# Patient Record
Sex: Female | Born: 1955 | Hispanic: No | Marital: Married | Smoking: Never smoker
Health system: Southern US, Community
[De-identification: ages and names within clinical notes are randomized; demographics above are authoritative.]

## PROBLEM LIST (undated history)

## (undated) DIAGNOSIS — H35369 Drusen (degenerative) of macula, unspecified eye: Secondary | ICD-10-CM

## (undated) DIAGNOSIS — G43909 Migraine, unspecified, not intractable, without status migrainosus: Secondary | ICD-10-CM

## (undated) DIAGNOSIS — R7303 Prediabetes: Secondary | ICD-10-CM

## (undated) DIAGNOSIS — N200 Calculus of kidney: Secondary | ICD-10-CM

## (undated) DIAGNOSIS — H409 Unspecified glaucoma: Secondary | ICD-10-CM

## (undated) DIAGNOSIS — C50919 Malignant neoplasm of unspecified site of unspecified female breast: Secondary | ICD-10-CM

## (undated) DIAGNOSIS — I10 Essential (primary) hypertension: Secondary | ICD-10-CM

## (undated) DIAGNOSIS — H269 Unspecified cataract: Secondary | ICD-10-CM

## (undated) HISTORY — DX: Malignant neoplasm of unspecified site of unspecified female breast: C50.919

## (undated) HISTORY — DX: Unspecified glaucoma: H40.9

## (undated) HISTORY — PX: OTHER SURGICAL HISTORY: SHX169

## (undated) HISTORY — DX: Drusen (degenerative) of macula, unspecified eye: H35.369

## (undated) HISTORY — DX: Unspecified cataract: H26.9

## (undated) HISTORY — PX: EYE SURGERY: SHX253

## (undated) HISTORY — DX: Migraine, unspecified, not intractable, without status migrainosus: G43.909

---

## 2014-02-24 ENCOUNTER — Ambulatory Visit: Payer: Self-pay | Admitting: Family

## 2014-03-10 ENCOUNTER — Ambulatory Visit (INDEPENDENT_AMBULATORY_CARE_PROVIDER_SITE_OTHER): Payer: 59 | Admitting: Family

## 2014-03-10 ENCOUNTER — Other Ambulatory Visit: Payer: Self-pay | Admitting: Family

## 2014-03-10 ENCOUNTER — Telehealth: Payer: Self-pay | Admitting: *Deleted

## 2014-03-10 ENCOUNTER — Other Ambulatory Visit (HOSPITAL_COMMUNITY)
Admission: RE | Admit: 2014-03-10 | Discharge: 2014-03-10 | Disposition: A | Discharge status: Home / Self Care | Payer: 59 | Source: Ambulatory Visit | Attending: Family | Admitting: Family

## 2014-03-10 ENCOUNTER — Encounter: Payer: Self-pay | Admitting: Family

## 2014-03-10 VITALS — BP 118/80 | HR 65 | Temp 97.7°F | Resp 16 | Ht 62.0 in | Wt 162.1 lb

## 2014-03-10 DIAGNOSIS — Z0000 Encounter for general adult medical examination without abnormal findings: Secondary | ICD-10-CM

## 2014-03-10 DIAGNOSIS — Z1231 Encounter for screening mammogram for malignant neoplasm of breast: Secondary | ICD-10-CM

## 2014-03-10 DIAGNOSIS — G43909 Migraine, unspecified, not intractable, without status migrainosus: Secondary | ICD-10-CM

## 2014-03-10 DIAGNOSIS — Z1151 Encounter for screening for human papillomavirus (HPV): Secondary | ICD-10-CM | POA: Insufficient documentation

## 2014-03-10 DIAGNOSIS — R21 Rash and other nonspecific skin eruption: Secondary | ICD-10-CM

## 2014-03-10 DIAGNOSIS — Z01419 Encounter for gynecological examination (general) (routine) without abnormal findings: Secondary | ICD-10-CM

## 2014-03-10 LAB — CBC WITH DIFFERENTIAL/PLATELET
BASOS ABS: 0 10*3/uL (ref 0.0–0.1)
BASOS PCT: 0 % (ref 0–1)
EOS PCT: 3 % (ref 0–5)
Eosinophils Absolute: 0.3 10*3/uL (ref 0.0–0.7)
HEMATOCRIT: 37.8 % (ref 36.0–46.0)
Hemoglobin: 12.5 g/dL (ref 12.0–15.0)
LYMPHS PCT: 32 % (ref 12–46)
Lymphs Abs: 2.8 10*3/uL (ref 0.7–4.0)
MCH: 25.2 pg — ABNORMAL LOW (ref 26.0–34.0)
MCHC: 33.1 g/dL (ref 30.0–36.0)
MCV: 76.1 fL — AB (ref 78.0–100.0)
Monocytes Absolute: 0.6 10*3/uL (ref 0.1–1.0)
Monocytes Relative: 7 % (ref 3–12)
Neutro Abs: 5.1 10*3/uL (ref 1.7–7.7)
Neutrophils Relative %: 58 % (ref 43–77)
Platelets: 256 10*3/uL (ref 150–400)
RBC: 4.97 MIL/uL (ref 3.87–5.11)
RDW: 14.4 % (ref 11.5–15.5)
WBC: 8.8 10*3/uL (ref 4.0–10.5)

## 2014-03-10 LAB — BASIC METABOLIC PANEL WITH GFR
BUN: 12 mg/dL (ref 6–23)
CHLORIDE: 105 meq/L (ref 96–112)
CO2: 26 meq/L (ref 19–32)
CREATININE: 0.64 mg/dL (ref 0.50–1.10)
Calcium: 9.3 mg/dL (ref 8.4–10.5)
GFR, Est African American: >89 mL/min
GFR, Est Non African American: >89 mL/min
GLUCOSE: 106 mg/dL — AB (ref 70–99)
Potassium: 4.5 mEq/L (ref 3.5–5.3)
Sodium: 138 mEq/L (ref 135–145)

## 2014-03-10 LAB — HEPATIC FUNCTION PANEL
ALT: 17 U/L (ref 0–35)
AST: 18 U/L (ref 0–37)
Albumin: 3.8 g/dL (ref 3.5–5.2)
Alkaline Phosphatase: 64 U/L (ref 39–117)
BILIRUBIN INDIRECT: 0.3 mg/dL (ref 0.2–1.2)
Bilirubin, Direct: 0.1 mg/dL (ref 0.0–0.3)
Total Bilirubin: 0.4 mg/dL (ref 0.2–1.2)
Total Protein: 7.1 g/dL (ref 6.0–8.3)

## 2014-03-10 LAB — LIPID PANEL
Cholesterol: 265 mg/dL — ABNORMAL HIGH (ref 0–200)
HDL: 60 mg/dL (ref >39)
LDL Cholesterol: 185 mg/dL — ABNORMAL HIGH (ref 0–99)
TRIGLYCERIDES: 99 mg/dL (ref <150)
Total CHOL/HDL Ratio: 4.4 Ratio
VLDL: 20 mg/dL (ref 0–40)

## 2014-03-10 MED ORDER — BETAMETHASONE DIPROPIONATE 0.05 % EX CREA: TOPICAL_CREAM | Freq: Two times a day (BID) | CUTANEOUS | Status: DC

## 2014-03-10 NOTE — Assessment & Plan Note (Signed)
She reports good relief with prn use of aleve.  Monitor.

## 2014-03-10 NOTE — Assessment & Plan Note (Signed)
I suspect that she is sensitive to the latex in the gloves that she wears at work.  Recommend that she request latex free gloves from her employer. Also, will rx with diprolene cream PRN.

## 2014-03-10 NOTE — Telephone Encounter (Signed)
Once daily please 

## 2014-03-10 NOTE — Telephone Encounter (Signed)
Received message from pharmacy wanting to clarify directions for betamethasone cream. Directions state to apply once a day and twice a day.  Which direction to use?

## 2014-03-10 NOTE — Assessment & Plan Note (Signed)
We discussed healthy diet, exercise.  Refer for mammogram, dexa, colonoscopy.  Obtain fasting lab work.  Pap performed today.

## 2014-03-10 NOTE — Progress Notes (Signed)
Subjective:    Patient ID: Jarrett Chicoine, female    DOB: 1955/12/11, 58 y.o.   MRN: [id number]  HPI  Ms. Whitner is a 58 yr old female who presents today to establish care.  Has not had recent pcp.    Rash- reports rash on left hand- can be pruritic.  Wears latex gloves at work.    Migraines- reports that she has migraines on occasion- worse if she is in the sun.    Migraines- has migraine between once a week and once a week. Uses aleve with good relief.  Preventative care: Immunizations: last tetanus was 2008 Diet: reports healthy diet Exercise: none  Colonoscopy: never Dexa: never Pap Smear: never Mammogram: never   Review of Systems  Constitutional:       Reports stable weight  HENT: Negative for hearing loss and rhinorrhea.   Eyes:       Wears glasses  Respiratory: Negative for cough and shortness of breath.   Cardiovascular: Negative for chest pain.  Gastrointestinal:       Nausea with migraine  Genitourinary:       Occasional urinary incontinence.   Musculoskeletal:       Occasional hand and shoulder pain  Hematological: Negative for adenopathy.  Psychiatric/Behavioral:       Denies depression/anxiety   Past Medical History  Diagnosis Date  . Migraine     History   Social History  . Marital Status: Married    Spouse Name: N/A    Number of Children: N/A  . Years of Education: N/A   Occupational History  . Not on file.   Social History Main Topics  . Smoking status: Never Smoker   . Smokeless tobacco: Never Used  . Alcohol Use: No  . Drug Use: Not on file  . Sexual Activity: Not on file   Other Topics Concern  . 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 No pertinent past surgical history.  Family History  Problem Relation Age of Onset  . Heart disease Father    . Kidney disease Father     No Known Allergies  No current outpatient prescriptions on file prior to visit.   No current facility-administered medications on file prior to visit.    BP 118/80  Pulse 65  Temp(Src) 97.7 F (36.5 C) (Oral)  Resp 16  Ht 5\' 2"  (1.575 m)  Wt 162 lb 1.3 oz (73.519 kg)  BMI 29.64 kg/m2  SpO2 98%  LMP 12/02/2003        Objective:   Physical Exam Physical Exam  Constitutional: She is oriented to person, place, and time. She appears well-developed and well-nourished. No distress.  HENT:  Head: Normocephalic and atraumatic.  Right Ear: Tympanic membrane and ear canal normal.  Left Ear: Tympanic membrane and ear canal normal.  Mouth/Throat: Oropharynx is clear and moist.  Eyes: Pupils are equal, round, and reactive to light. No scleral icterus.  Neck: Normal range of motion. No thyromegaly present.  Cardiovascular: Normal rate and regular rhythm.   No murmur heard. Pulmonary/Chest: Effort normal and breath sounds normal. No respiratory distress. He has no wheezes. She has no rales. She exhibits no tenderness.  Abdominal: Soft. Bowel sounds are normal. He  exhibits no distension and no mass. There is no tenderness. There is no rebound and no guarding.  Musculoskeletal: She exhibits no edema.  Lymphadenopathy:    She has no cervical adenopathy.  Neurological: She is alert and oriented to person, place, and time. She exhibits normal muscle tone. Coordination normal.  Skin: Skin is warm and dry. dry, raised rash left dorsal hand.  Psychiatric: She has a normal mood and affect. Her behavior is normal. Judgment and thought content normal.  Breasts: Examined lying Right: Without masses, retractions, discharge or axillary adenopathy.  Left: Without masses, retractions, discharge or axillary adenopathy.  Inguinal/mons: Normal without inguinal adenopathy  External genitalia: Normal  BUS/Urethra/Skene's glands: Normal  Bladder: Normal  Vagina: Normal    Cervix: Normal  Uterus: normal in size, shape and contour. Midline and mobile  Adnexa/parametria:  Rt: Without masses or tenderness.  Lt: Without masses or tenderness.  Anus and perineum: Normal           Assessment & Plan:          Assessment & Plan:

## 2014-03-10 NOTE — Patient Instructions (Addendum)
Please complete lab work prior to leaving. Schedule mammogram on the first floor. We will contact you re: scheduling of colonoscopy and bone density. Try to use powder free/latex free gloves at home. Follow up in 1 year, sooner if problems/concerns.  Welcome to [facility]!

## 2014-03-10 NOTE — Telephone Encounter (Signed)
Notified pharmacist.

## 2014-03-11 LAB — URINALYSIS, ROUTINE W REFLEX MICROSCOPIC
BILIRUBIN URINE: NEGATIVE
Glucose, UA: NEGATIVE mg/dL
Hgb urine dipstick: NEGATIVE
Ketones, ur: NEGATIVE mg/dL
LEUKOCYTES UA: NEGATIVE
NITRITE: NEGATIVE
Protein, ur: NEGATIVE mg/dL
Specific Gravity, Urine: 1.017 (ref 1.005–1.030)
Urobilinogen, UA: 0.2 mg/dL (ref 0.0–1.0)
pH: 5.5 (ref 5.0–8.0)

## 2014-03-11 LAB — TSH: TSH: 2.123 u[IU]/mL (ref 0.350–4.500)

## 2014-03-13 ENCOUNTER — Telehealth: Payer: Self-pay | Admitting: Family

## 2014-03-13 ENCOUNTER — Ambulatory Visit (HOSPITAL_BASED_OUTPATIENT_CLINIC_OR_DEPARTMENT_OTHER)
Admission: RE | Admit: 2014-03-13 | Discharge: 2014-03-13 | Disposition: A | Discharge status: Home / Self Care | Payer: 59 | Source: Ambulatory Visit | Attending: Family | Admitting: Family

## 2014-03-13 DIAGNOSIS — E1129 Type 2 diabetes mellitus with other diabetic kidney complication: Secondary | ICD-10-CM | POA: Insufficient documentation

## 2014-03-13 DIAGNOSIS — E785 Hyperlipidemia, unspecified: Secondary | ICD-10-CM | POA: Insufficient documentation

## 2014-03-13 DIAGNOSIS — IMO0001 Reserved for inherently not codable concepts without codable children: Secondary | ICD-10-CM | POA: Insufficient documentation

## 2014-03-13 DIAGNOSIS — Z1231 Encounter for screening mammogram for malignant neoplasm of breast: Secondary | ICD-10-CM | POA: Insufficient documentation

## 2014-03-13 DIAGNOSIS — E119 Type 2 diabetes mellitus without complications: Secondary | ICD-10-CM

## 2014-03-13 HISTORY — DX: Reserved for inherently not codable concepts without codable children: IMO0001

## 2014-03-13 LAB — HEMOGLOBIN A1C
Hgb A1c MFr Bld: 6.5 % — ABNORMAL HIGH (ref <5.7)
Mean Plasma Glucose: 140 mg/dL — ABNORMAL HIGH (ref <117)

## 2014-03-13 MED ORDER — ATORVASTATIN CALCIUM 40 MG PO TABS: 40.0000 mg | ORAL_TABLET | Freq: Every day | ORAL | Status: DC

## 2014-03-13 NOTE — Telephone Encounter (Signed)
Please call pt and let her know that her blood work shows diabetes.  Sugar is at level that can be controlled with diet, exercise and weight loss.  Limit white starches (bread, rice, potatoes) and instead eat more fresh veggies. Pap smear is normal.  Cholesterol is very high.  I would like her to add a statin medication- especially important due to new finding of diabetes as well as aspirin 81mg  once daily.  Repeat flp/lft in 6 weeks.  Call if unusual muscle pain occurs after starting atorvastatin.

## 2014-03-14 NOTE — Telephone Encounter (Signed)
Left message for pt to return my call.

## 2014-03-17 ENCOUNTER — Ambulatory Visit (INDEPENDENT_AMBULATORY_CARE_PROVIDER_SITE_OTHER)
Admission: RE | Admit: 2014-03-17 | Discharge: 2014-03-17 | Disposition: A | Discharge status: Home / Self Care | Payer: 59 | Source: Ambulatory Visit | Attending: Family | Admitting: Family

## 2014-03-17 DIAGNOSIS — Z0000 Encounter for general adult medical examination without abnormal findings: Secondary | ICD-10-CM

## 2014-03-17 NOTE — Telephone Encounter (Signed)
Received message from pt's daughter stating pt received our previous message but wanted [place] to relay information to her daughter. They are listed on HIPPA. Called [phone] and left detailed message re: below results and to call if any questions.

## 2014-03-24 ENCOUNTER — Ambulatory Visit (INDEPENDENT_AMBULATORY_CARE_PROVIDER_SITE_OTHER): Payer: 59 | Admitting: Family

## 2014-03-24 ENCOUNTER — Telehealth: Payer: Self-pay | Admitting: Family

## 2014-03-24 VITALS — BP 104/64 | HR 76 | Temp 97.7°F | Resp 16 | Ht 62.0 in | Wt 162.0 lb

## 2014-03-24 DIAGNOSIS — R32 Unspecified urinary incontinence: Secondary | ICD-10-CM | POA: Insufficient documentation

## 2014-03-24 DIAGNOSIS — R079 Chest pain, unspecified: Secondary | ICD-10-CM | POA: Insufficient documentation

## 2014-03-24 DIAGNOSIS — E119 Type 2 diabetes mellitus without complications: Secondary | ICD-10-CM

## 2014-03-24 LAB — MICROALBUMIN / CREATININE URINE RATIO
Creatinine, Urine: 131.2 mg/dL
Microalb Creat Ratio: 5.3 mg/g (ref 0.0–30.0)
Microalb, Ur: 0.69 mg/dL (ref 0.00–1.89)

## 2014-03-24 NOTE — Assessment & Plan Note (Signed)
EKG is performed and reviewed- notes NSR without acute changes. Advised pt to proceed with stress test and to go directly to the ER if she develops recurrent chest pain.

## 2014-03-24 NOTE — Progress Notes (Signed)
Subjective:    Patient ID: Chloe Reid, female    DOB: 1956/07/22, 58 y.o.   MRN: [id number]  HPI  Chloe Reid is a 58 yr old female who presents today with two concerns:  1) Chest pain- reports that 4 days ago she developed left sided chest pain.  Started off slowly.  Pain radiated to her back and down the left arm. Occurred when she was cooking.  Pain is described as constant on the 1st day. Has been intermittent since that time.  She has not tried any medication.  She reports mild nausea the first day which resolved. She denies associated SOB.  Pain is not worsened by exertion.  Dad had MI, first MI was in his 28's.  Reports that she has had this pain in the past but the discomfort was not as bad last time.    2) Urinary incontinence- She reports + bladder incontinence.  Incontinence has been present for 1-2 years.   She is accompanied today by her daughter who helps to translate.  Review of Systems See HPI  Past Medical History  Diagnosis Date  . Migraine     History   Social History  . Marital Status: Married    Spouse Name: N/A    Number of Children: N/A  . Years of Education: N/A   Occupational History  . Not on file.   Social History Main Topics  . Smoking status: Never Smoker   . Smokeless tobacco: Never Used  . Alcohol Use: No  . Drug Use: Not on file  . Sexual Activity: Not on file   Other Topics Concern  . 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  Family History  Problem Relation Age of Onset  . Heart disease Father   . Kidney disease Father     No Known Allergies  Current Outpatient Prescriptions on File Prior to Visit  Medication Sig Dispense Refill  . aspirin EC 81 MG tablet Take 81 mg by mouth daily.      Marland Kitchen atorvastatin (LIPITOR) 40 MG tablet  Take 1 tablet (40 mg total) by mouth daily.  30 tablet  1  . Multiple Vitamin (MULTIVITAMIN) tablet Take 1 tablet by mouth daily.      . naproxen sodium (ALEVE) 220 MG tablet Take 220 mg by mouth as needed.      . betamethasone dipropionate (DIPROLENE) 0.05 % cream Apply topically 2 (two) times daily. Apply to hand rash once daily as needed.  30 g  0   No current facility-administered medications on file prior to visit.    BP 104/64  Pulse 76  Temp(Src) 97.7 F (36.5 C) (Oral)  Resp 16  Ht 5\' 2"  (1.575 m)  Wt 162 lb (73.483 kg)  BMI 29.62 kg/m2  SpO2 99%  LMP 12/02/2003       Objective:   Physical Exam  Constitutional: She is oriented to person, place, and time. She appears well-developed and well-nourished. No distress.  HENT:  Head: Normocephalic and atraumatic.  Cardiovascular: Normal rate and regular rhythm.   No murmur heard. Pulmonary/Chest: Effort normal and breath sounds normal. No respiratory distress. She has no wheezes. She has no rales. She exhibits  no tenderness.  Neurological: She is alert and oriented to person, place, and time.  Psychiatric: She has a normal mood and affect. Her behavior is normal. Judgment and thought content normal.          Assessment & Plan:

## 2014-03-24 NOTE — Assessment & Plan Note (Signed)
Will refer to GYN for further evaluation.  

## 2014-03-24 NOTE — Patient Instructions (Signed)
Please complete lab work prior to leaving. You will be contacted about your stress test and GYN referral. Let me know if you have not heard back in 1 week about these referrals. Go to the ER if you develop recurrent chest pain. Follow up in 3 months, sooner if problems or concerns.

## 2014-03-24 NOTE — Progress Notes (Signed)
Pre visit review using our clinic review tool, if applicable. No additional management support is needed unless otherwise documented below in the visit note. 

## 2014-03-24 NOTE — Assessment & Plan Note (Signed)
New diagnosis. Discussed diabetic diet.  Obtain urine microalbumin.

## 2014-03-24 NOTE — Telephone Encounter (Signed)
Prior [name] is obtained, number is:  [phone].

## 2014-03-24 NOTE — Telephone Encounter (Signed)
Message copied by Debbrah Alar on Fri Mar 24, 2014  2:45 PM ------      Message from: Synthia Innocent      Created: Fri Mar 24, 2014  2:36 PM       Pt will need peer to peer review for stress test. Please call insurance @ [phone] option #3 [id number]. Thanks ------

## 2014-03-27 ENCOUNTER — Telehealth: Payer: Self-pay

## 2014-03-27 NOTE — Telephone Encounter (Signed)
Relevant patient education mailed to patient.  

## 2014-03-30 ENCOUNTER — Telehealth: Payer: Self-pay | Admitting: Family

## 2014-03-30 ENCOUNTER — Encounter (HOSPITAL_COMMUNITY): Payer: 59

## 2014-03-30 NOTE — Telephone Encounter (Signed)
Message copied by Debbrah Alar on Thu Mar 30, 2014  8:29 PM ------      Message from: Hubbard Robinson      Created: Thu Mar 30, 2014  1:19 PM       Ms. Senkbeil was a no show for her nuclear stress test today. I attempted to call her to reschedule her with no answer.            Crissie Figures, RN ------

## 2014-03-31 ENCOUNTER — Encounter: Payer: Self-pay | Admitting: Family

## 2014-03-31 MED ORDER — CALCIUM CARBONATE-VITAMIN D 600-400 MG-UNIT PO TABS: 1.0000 | ORAL_TABLET | Freq: Every day | ORAL | Status: DC

## 2014-04-06 ENCOUNTER — Telehealth (HOSPITAL_COMMUNITY): Payer: Self-pay | Admitting: *Deleted

## 2014-04-10 ENCOUNTER — Encounter: Payer: Self-pay | Admitting: Family

## 2014-04-12 ENCOUNTER — Telehealth (HOSPITAL_COMMUNITY): Payer: Self-pay

## 2014-04-14 ENCOUNTER — Ambulatory Visit (HOSPITAL_COMMUNITY)
Admission: RE | Admit: 2014-04-14 | Discharge: 2014-04-14 | Disposition: A | Discharge status: Home / Self Care | Payer: 59 | Source: Ambulatory Visit | Attending: Cardiovascular Disease | Admitting: Cardiovascular Disease

## 2014-04-14 DIAGNOSIS — R079 Chest pain, unspecified: Secondary | ICD-10-CM | POA: Insufficient documentation

## 2014-04-14 MED ORDER — TECHNETIUM TC 99M SESTAMIBI GENERIC - CARDIOLITE
30.4000 | Freq: Once | INTRAVENOUS | Status: AC | PRN
  Administered 2014-04-14: 30 via INTRAVENOUS

## 2014-04-14 MED ORDER — TECHNETIUM TC 99M SESTAMIBI GENERIC - CARDIOLITE
10.5000 | Freq: Once | INTRAVENOUS | Status: AC | PRN
  Administered 2014-04-14: 11 via INTRAVENOUS

## 2014-04-14 NOTE — Procedures (Addendum)
[facility] [address] [place] [address] [phone]  Cardiology Nuclear Med Study  Chloe Reid is a 58 y.o. female     MRN : [id number]     DOB: 06-Sep-1956  Procedure Date: 04/14/2014  Nuclear Med Background Indication for Stress Test:  Evaluation for Ischemia History:  No prior cardiac or respiratory history reported. No prior NUC MPI fo rcomparison. Cardiac Risk Factors: Family History - CAD, Lipids, NIDDM and Overweight  Symptoms:  Chest Pain, Dizziness and DOE   Nuclear Pre-Procedure Caffeine/Decaff Intake:  12:00am NPO After: 10am   IV Site: R Forearm  IV 0.9% NS with Angio Cath:  22g  Chest Size (in):  n/a IV Started by: Rolene Course, RN  Height: 5\' 2"  (1.575 m)  Cup Size: D  BMI:  Body mass index is 29.62 kg/(m^2). Weight:  162 lb (73.483 kg)   Tech Comments:  n/a    Nuclear Med Study 1 or 2 day study: 1 day  Stress Test Type:  Stress  Order Authorizing Provider:  Willette Alma, MD   Resting Radionuclide: Technetium 54m Sestamibi  Resting Radionuclide Dose: 10.5 mCi   Stress Radionuclide:  Technetium 44m Sestamibi  Stress Radionuclide Dose: 30.4 mCi           Stress Protocol Rest HR: 60 Stress HR: 141  Rest BP: 146/91 Stress BP: 148/107  Exercise Time (min): 8:20 METS: 8.7   Predicted Max HR: 161 bpm % Max HR: 87.58 bpm Rate Pressure Product: 20868  Dose of Adenosine (mg):  n/a Dose of Lexiscan: n/a mg  Dose of Atropine (mg): n/a Dose of Dobutamine: n/a mcg/kg/min (at max HR)  Stress Test Technologist: Leane Para, CCT Nuclear Technologist: Imagene Riches, CNMT   Rest Procedure:  Myocardial perfusion imaging was performed at rest 45 minutes following the intravenous administration of Technetium 1m Sestamibi. Stress Procedure:  The patient performed treadmill exercise using a Bruce  Protocol for 8:20 minutes. The patient stopped due to SOB and Fatigue and denied any chest pain.  There were no  significant ST-T wave changes.  Technetium 65m Sestamibi was injected at peak exercise and myocardial perfusion imaging was performed after a brief delay.  Transient Ischemic Dilatation (Normal <1.22):  0.88 Lung/Heart Ratio (Normal <0.45):  0.29 QGS EDV:  56 ml QGS ESV:  15 ml LV Ejection Fraction: 73%      Rest ECG: NSR - Normal EKG  Stress ECG: No significant change from baseline ECG  QPS Raw Data Images:  Normal; no motion artifact; normal heart/lung ratio. Stress Images:  Normal homogeneous uptake in all areas of the myocardium. Rest Images:  Normal homogeneous uptake in all areas of the myocardium. Subtraction (SDS):  No evidence of ischemia. LV Wall Motion:  NL LV Function; NL Wall Motion  Impression Exercise Capacity:  Fair exercise capacity. BP Response:  Hypertensive blood pressure response. Clinical Symptoms:  No significant symptoms noted. ECG Impression:  No significant ST segment change suggestive of ischemia. Comparison with Prior Nuclear Study: No previous nuclear study performed   Overall Impression:  Normal stress nuclear study.   Sanda Klein, MD  04/14/2014 4:31 PM

## 2014-06-08 NOTE — Telephone Encounter (Signed)
Encounter complete. 

## 2014-06-13 ENCOUNTER — Telehealth: Payer: Self-pay

## 2014-06-13 DIAGNOSIS — E119 Type 2 diabetes mellitus without complications: Secondary | ICD-10-CM

## 2014-06-13 NOTE — Telephone Encounter (Signed)
Pt comes in on 06-30-14. Melissa already has lipid ordered I will add A1C

## 2014-06-30 ENCOUNTER — Ambulatory Visit: Payer: 59 | Admitting: Family

## 2014-07-07 ENCOUNTER — Encounter: Payer: Self-pay | Admitting: Family

## 2014-07-07 ENCOUNTER — Ambulatory Visit (INDEPENDENT_AMBULATORY_CARE_PROVIDER_SITE_OTHER): Payer: 59 | Admitting: Family

## 2014-07-07 VITALS — BP 120/80 | HR 63 | Temp 98.3°F | Resp 16 | Ht 62.0 in | Wt 159.1 lb

## 2014-07-07 DIAGNOSIS — N393 Stress incontinence (female) (male): Secondary | ICD-10-CM

## 2014-07-07 DIAGNOSIS — M8580 Other specified disorders of bone density and structure, unspecified site: Secondary | ICD-10-CM

## 2014-07-07 DIAGNOSIS — M949 Disorder of cartilage, unspecified: Secondary | ICD-10-CM

## 2014-07-07 DIAGNOSIS — E785 Hyperlipidemia, unspecified: Secondary | ICD-10-CM

## 2014-07-07 DIAGNOSIS — B351 Tinea unguium: Secondary | ICD-10-CM

## 2014-07-07 DIAGNOSIS — E559 Vitamin D deficiency, unspecified: Secondary | ICD-10-CM

## 2014-07-07 DIAGNOSIS — E119 Type 2 diabetes mellitus without complications: Secondary | ICD-10-CM

## 2014-07-07 DIAGNOSIS — M899 Disorder of bone, unspecified: Secondary | ICD-10-CM

## 2014-07-07 LAB — HEPATIC FUNCTION PANEL
ALBUMIN: 4.2 g/dL (ref 3.5–5.2)
ALK PHOS: 57 U/L (ref 39–117)
ALT: 16 U/L (ref 0–35)
AST: 17 U/L (ref 0–37)
Bilirubin, Direct: 0.1 mg/dL (ref 0.0–0.3)
Indirect Bilirubin: 0.3 mg/dL (ref 0.2–1.2)
TOTAL PROTEIN: 7.6 g/dL (ref 6.0–8.3)
Total Bilirubin: 0.4 mg/dL (ref 0.2–1.2)

## 2014-07-07 LAB — HEMOGLOBIN A1C
Hgb A1c MFr Bld: 6.6 % — ABNORMAL HIGH (ref <5.7)
Mean Plasma Glucose: 143 mg/dL — ABNORMAL HIGH (ref <117)

## 2014-07-07 LAB — LIPID PANEL
CHOLESTEROL: 244 mg/dL — AB (ref 0–200)
HDL: 70 mg/dL (ref >39)
LDL Cholesterol: 159 mg/dL — ABNORMAL HIGH (ref 0–99)
Total CHOL/HDL Ratio: 3.5 Ratio
Triglycerides: 73 mg/dL (ref <150)
VLDL: 15 mg/dL (ref 0–40)

## 2014-07-07 LAB — BASIC METABOLIC PANEL WITH GFR
BUN: 18 mg/dL (ref 6–23)
CALCIUM: 9.1 mg/dL (ref 8.4–10.5)
CO2: 22 meq/L (ref 19–32)
CREATININE: 0.61 mg/dL (ref 0.50–1.10)
Chloride: 107 mEq/L (ref 96–112)
GFR, Est African American: >89 mL/min
GFR, Est Non African American: >89 mL/min
GLUCOSE: 102 mg/dL — AB (ref 70–99)
Potassium: 4.1 mEq/L (ref 3.5–5.3)
Sodium: 139 mEq/L (ref 135–145)

## 2014-07-07 MED ORDER — TERBINAFINE HCL 250 MG PO TABS: 250.0000 mg | ORAL_TABLET | Freq: Every day | ORAL | Status: DC

## 2014-07-07 NOTE — Progress Notes (Signed)
Pre visit review using our clinic review tool, if applicable. No additional management support is needed unless otherwise documented below in the visit note. 

## 2014-07-07 NOTE — Progress Notes (Signed)
Subjective:    Patient ID: Chloe Reid, female    DOB: [birth year], 58 y.o.   MRN: [id number]  HPI  Ms. Shevlin is a 58 yr old female who presents today for follow up.  1) DM2-last eye exam was 2 years ago.   2) chest pain- was initially evaluated for this on 03/24/14.  She was referred for nuclear stress test which was completed on 04/14/14 and was negative. She reports that she has not recently experienced chest pain.   3) DM2-  Newly diagnosed last visit.  Reports + pneumovax 2011 Lab Results  Component Value Date   HGBA1C 6.5* 03/10/2014   Lab Results  Component Value Date   MICROALBUR 0.69 03/24/2014   LDLCALC 185* 03/10/2014   CREATININE 0.64 03/10/2014   4) Urinary incontinence- she was referred to GYN for further evaluation. She did not go.  She would like to reschedule.   5) Hyperlipidemia-  LDL was noted to be elevated. She was placed on atorvastatin. It was recommended that she repeat flp/lft in 6 weeks. Denies associated myalgia.    Review of Systems See HPI  Past Medical History  Diagnosis Date  . Migraine     History   Social History  . Marital Status: Married    Spouse Name: N/A    Number of Children: N/A  . Years of Education: N/A   Occupational History  . Not on file.   Social History Main Topics  . Smoking status: Never Smoker   . Smokeless tobacco: Never Used  . Alcohol Use: No  . Drug Use: Not on file  . Sexual Activity: Not on file   Other Topics Concern  . 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  Family History  Problem Relation Age of Onset  . Heart disease Father   . Kidney disease Father     No Known Allergies  Current Outpatient Prescriptions on File Prior to Visit  Medication Sig Dispense Refill  . aspirin EC 81 MG tablet Take  81 mg by mouth daily.      Marland Kitchen atorvastatin (LIPITOR) 40 MG tablet Take 1 tablet (40 mg total) by mouth daily.  30 tablet  1  . betamethasone dipropionate (DIPROLENE) 0.05 % cream Apply topically 2 (two) times daily. Apply to hand rash once daily as needed.  30 g  0  . naproxen sodium (ALEVE) 220 MG tablet Take 220 mg by mouth as needed.      . Calcium Carbonate-Vitamin D (CALTRATE 600+D) 600-400 MG-UNIT per tablet Take 1 tablet by mouth daily.      . Multiple Vitamin (MULTIVITAMIN) tablet Take 1 tablet by mouth daily.       No current facility-administered medications on file prior to visit.    BP 120/80  Pulse 63  Temp(Src) 98.3 F (36.8 C) (Oral)  Resp 16  Ht 5\' 2"  (1.575 m)  Wt 159 lb 1.3 oz (72.158 kg)  BMI 29.09 kg/m2  SpO2 96%  LMP 12/02/2003       Objective:   Physical Exam  Constitutional: She is oriented to person, place, and time. She appears well-developed and well-nourished. No distress.  Cardiovascular: Normal rate and regular rhythm.  No murmur heard. Pulmonary/Chest: Effort normal and breath sounds normal. No respiratory distress. She has no wheezes. She has no rales. She exhibits no tenderness.  Musculoskeletal: She exhibits no edema.  Neurological: She is alert and oriented to person, place, and time.  Psychiatric: She has a normal mood and affect. Her behavior is normal. Judgment and thought content normal.          Assessment & Plan:

## 2014-07-07 NOTE — Patient Instructions (Addendum)
Please complete lab work prior to leaving.  Start lamisil once daily to toenails.  Follow up in 6 weeks.

## 2014-07-08 ENCOUNTER — Telehealth: Payer: Self-pay | Admitting: Family

## 2014-07-08 DIAGNOSIS — E785 Hyperlipidemia, unspecified: Secondary | ICD-10-CM

## 2014-07-08 LAB — VITAMIN D 25 HYDROXY (VIT D DEFICIENCY, FRACTURES): Vit D, 25-Hydroxy: 32 ng/mL (ref 30–89)

## 2014-07-08 MED ORDER — ATORVASTATIN CALCIUM 80 MG PO TABS: 80.0000 mg | ORAL_TABLET | Freq: Every day | ORAL | Status: DC

## 2014-07-08 NOTE — Telephone Encounter (Signed)
Sugar appears controlled. Cholesterol remains elevated.  Increase lipitor from 40mg  to 80 mg.  Repeat flp/lft in 6 weeks.

## 2014-07-09 DIAGNOSIS — B351 Tinea unguium: Secondary | ICD-10-CM | POA: Insufficient documentation

## 2014-07-09 NOTE — Assessment & Plan Note (Signed)
Lab Results  Component Value Date   CHOL 244* 07/07/2014   HDL 70 07/07/2014   LDLCALC 159* 07/07/2014   TRIG 73 07/07/2014   CHOLHDL 3.5 07/07/2014   Still above goal.  Will increase dose of atorvastatin.

## 2014-07-09 NOTE — Assessment & Plan Note (Signed)
Will add lamisil, repeat lft in 6 weeks.

## 2014-07-09 NOTE — Assessment & Plan Note (Signed)
Reports that her last eye exam was 2 years ago.  Will refer for DM eye exam.  Reinforced diabetic diet and gave info on pakistani diet and DM.

## 2014-07-10 NOTE — Telephone Encounter (Signed)
Attempted to reach pt and was advised to call back around 4pm.

## 2014-07-12 NOTE — Telephone Encounter (Signed)
Notified pt's daughter and she voices understanding. Lab appt scheduled for 08/25/14 at 8:15am. Future lab order entered.

## 2014-08-18 ENCOUNTER — Ambulatory Visit: Payer: 59 | Admitting: Family

## 2014-08-25 ENCOUNTER — Encounter: Payer: Self-pay | Admitting: Family

## 2014-08-25 ENCOUNTER — Ambulatory Visit (INDEPENDENT_AMBULATORY_CARE_PROVIDER_SITE_OTHER): Payer: 59 | Admitting: Family

## 2014-08-25 ENCOUNTER — Other Ambulatory Visit: Payer: 59

## 2014-08-25 VITALS — BP 141/76 | HR 77 | Temp 97.8°F | Resp 16 | Ht 62.0 in | Wt 157.4 lb

## 2014-08-25 DIAGNOSIS — Z23 Encounter for immunization: Secondary | ICD-10-CM

## 2014-08-25 NOTE — Progress Notes (Signed)
Chloe Reid presented today for 6 week follow up on lamisil rx and labs. She has not started lamisil. Pt given flu shot today and advised to start lamisil and schedule follow up in early November.

## 2014-08-25 NOTE — Progress Notes (Signed)
Pre visit review using our clinic review tool, if applicable. No additional management support is needed unless otherwise documented below in the visit note. 

## 2014-08-25 NOTE — Patient Instructions (Signed)
Pick up Lamisil prescription from your pharmacy and start taking it for the fungal infection of your toenail. Schedule your next appointment after 10/07/14.

## 2014-10-03 ENCOUNTER — Encounter: Payer: Self-pay | Admitting: Family

## 2014-10-05 ENCOUNTER — Encounter: Payer: Self-pay | Admitting: Family

## 2014-10-13 ENCOUNTER — Ambulatory Visit: Payer: 59 | Admitting: Family

## 2014-10-20 ENCOUNTER — Ambulatory Visit: Payer: 59 | Admitting: Family

## 2014-11-03 ENCOUNTER — Encounter: Payer: Self-pay | Admitting: Family

## 2014-11-03 ENCOUNTER — Ambulatory Visit (INDEPENDENT_AMBULATORY_CARE_PROVIDER_SITE_OTHER): Payer: 59 | Admitting: Family

## 2014-11-03 VITALS — BP 100/70 | HR 68 | Temp 97.5°F | Resp 16 | Ht 62.0 in | Wt 156.2 lb

## 2014-11-03 DIAGNOSIS — B351 Tinea unguium: Secondary | ICD-10-CM

## 2014-11-03 DIAGNOSIS — E119 Type 2 diabetes mellitus without complications: Secondary | ICD-10-CM

## 2014-11-03 DIAGNOSIS — R739 Hyperglycemia, unspecified: Secondary | ICD-10-CM

## 2014-11-03 DIAGNOSIS — R32 Unspecified urinary incontinence: Secondary | ICD-10-CM

## 2014-11-03 DIAGNOSIS — E785 Hyperlipidemia, unspecified: Secondary | ICD-10-CM

## 2014-11-03 MED ORDER — TERBINAFINE HCL 250 MG PO TABS: 250.0000 mg | ORAL_TABLET | Freq: Every day | ORAL | Status: DC

## 2014-11-03 MED ORDER — ATORVASTATIN CALCIUM 80 MG PO TABS: 80.0000 mg | ORAL_TABLET | Freq: Every day | ORAL | Status: DC

## 2014-11-03 MED ORDER — MELOXICAM 7.5 MG PO TABS: 7.5000 mg | ORAL_TABLET | Freq: Every day | ORAL | Status: DC

## 2014-11-03 NOTE — Assessment & Plan Note (Signed)
Stable, obtain a1c.  

## 2014-11-03 NOTE — Assessment & Plan Note (Signed)
Resume statin, obtain follow up lipid panel in 3 months.

## 2014-11-03 NOTE — Assessment & Plan Note (Signed)
Improving after 8 weeks of lamisil. Obtain lft, rx with one additional month of lamisil.

## 2014-11-03 NOTE — Progress Notes (Signed)
Subjective:    Patient ID: Chloe Reid, female    DOB: [birth year], 58 y.o.   MRN: [id number]  HPI  Chloe Reid is a 58 yr old female who presents today for routine follow up.  1) DM2- this is diet controlled.   Lab Results  Component Value Date   HGBA1C 6.6* 07/07/2014   HGBA1C 6.5* 03/10/2014   Lab Results  Component Value Date   MICROALBUR 0.69 03/24/2014   LDLCALC 159* 07/07/2014   CREATININE 0.61 07/07/2014   2) Urinary incontinence- She reports that she saw GYN.  She reports that they told her to avoid coffee etc.    3) Hyperlipidemia-  lipitor dose was increased in august. She did not return for follow up FLP.  She has been out of refills.  Lab Results  Component Value Date   CHOL 244* 07/07/2014   HDL 70 07/07/2014   LDLCALC 159* 07/07/2014   TRIG 73 07/07/2014   CHOLHDL 3.5 07/07/2014   4) onychomycosis- started on lamisil 8/7. She did not return for lfts and therefore did not receive rx for 3rd month of treatment.   Review of Systems See HPI  Past Medical History  Diagnosis Date  . Migraine   . Cataracts, bilateral   . Glaucoma   . Macular drusen     History   Social History  . Marital Status: Married    Spouse Name: N/A    Number of Children: N/A  . Years of Education: N/A   Occupational History  . Not on file.   Social History Main Topics  . Smoking status: Never Smoker   . Smokeless tobacco: Never Used  . Alcohol Use: No  . Drug Use: Not on file  . Sexual Activity: Not on file   Other Topics Concern  . 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  Family History  Problem Relation Age of Onset  . Heart disease Father   . Kidney disease Father     No Known Allergies  Current Outpatient Prescriptions on File Prior to Visit  Medication  Sig Dispense Refill  . aspirin EC 81 MG tablet Take 81 mg by mouth daily.    . Calcium Carbonate-Vitamin D (CALTRATE 600+D) 600-400 MG-UNIT per tablet Take 1 tablet by mouth daily. (Patient not taking: Reported on 11/03/2014)    . Multiple Vitamin (MULTIVITAMIN) tablet Take 1 tablet by mouth daily.     No current facility-administered medications on file prior to visit.    BP 100/70 mmHg  Pulse 68  Temp(Src) 97.5 F (36.4 C) (Oral)  Resp 16  Ht 5\' 2"  (1.575 m)  Wt 156 lb 3.2 oz (70.852 kg)  BMI 28.56 kg/m2  SpO2 99%  LMP 12/02/2003       Objective:   Physical Exam  Constitutional: She is oriented to person, place, and time. She appears well-developed and well-nourished. No distress.  HENT:  Head: Normocephalic and atraumatic.  Cardiovascular: Normal rate and regular rhythm.   No murmur heard. Pulmonary/Chest: Effort normal and breath sounds normal. No respiratory distress. She has no wheezes. She has no rales. She exhibits no tenderness.  Neurological: She is alert and oriented to person, place, and time.  Skin: Skin is warm and dry.  Toenails remain dystrophic, however new growth appears healthier.   Psychiatric: She has a normal mood and affect. Her behavior is normal. Judgment and thought content normal.          Assessment & Plan:

## 2014-11-03 NOTE — Assessment & Plan Note (Signed)
Stable, no surgical intervention is recommended by GYN.

## 2014-11-03 NOTE — Progress Notes (Signed)
Pre visit review using our clinic review tool, if applicable. No additional management support is needed unless otherwise documented below in the visit note. 

## 2014-11-03 NOTE — Patient Instructions (Signed)
Please complete lab work prior to leaving. Restart lipitor. Start meloxicam for muscle pain- when you are done with this you can add tylenol as needed.  Follow up in 3 months.

## 2014-11-04 LAB — HEMOGLOBIN A1C: HEMOGLOBIN A1C: 7.2 % — AB (ref 4.6–6.5)

## 2014-11-05 LAB — BASIC METABOLIC PANEL
BUN: 17 mg/dL (ref 6–23)
CALCIUM: 9.5 mg/dL (ref 8.4–10.5)
CHLORIDE: 107 meq/L (ref 96–112)
CO2: 24 mEq/L (ref 19–32)
Creatinine, Ser: 0.8 mg/dL (ref 0.4–1.2)
GFR: 77.63 mL/min (ref >60.00)
Glucose, Bld: 80 mg/dL (ref 70–99)
Potassium: 4 mEq/L (ref 3.5–5.1)
Sodium: 139 mEq/L (ref 135–145)

## 2014-11-05 LAB — HEPATIC FUNCTION PANEL
ALT: 17 U/L (ref 0–35)
AST: 24 U/L (ref 0–37)
Albumin: 3.9 g/dL (ref 3.5–5.2)
Alkaline Phosphatase: 58 U/L (ref 39–117)
BILIRUBIN DIRECT: 0.1 mg/dL (ref 0.0–0.3)
BILIRUBIN TOTAL: 0.5 mg/dL (ref 0.2–1.2)
Total Protein: 7.5 g/dL (ref 6.0–8.3)

## 2014-11-07 ENCOUNTER — Telehealth: Payer: Self-pay | Admitting: Family

## 2014-11-07 MED ORDER — METFORMIN HCL 500 MG PO TABS: 500.0000 mg | ORAL_TABLET | Freq: Two times a day (BID) | ORAL | Status: DC

## 2014-11-07 NOTE — Telephone Encounter (Signed)
Sugar is above goal. I would like her to add metformin bid.

## 2014-11-09 NOTE — Telephone Encounter (Signed)
Called and spoke with patients daughter. She stated that patient did get the medication and did start the Metformin. No other questions/concerns at this time. JG//CMA

## 2015-02-02 ENCOUNTER — Ambulatory Visit: Payer: 59 | Admitting: Family

## 2015-02-07 ENCOUNTER — Telehealth: Payer: Self-pay | Admitting: Family

## 2015-02-07 ENCOUNTER — Encounter: Payer: Self-pay | Admitting: Family

## 2015-02-07 NOTE — Telephone Encounter (Signed)
Request sent 

## 2015-02-07 NOTE — Telephone Encounter (Signed)
PT was scheduled for follow up on 3/4- two previous no shows. Letter sent- charge no show?

## 2015-02-07 NOTE — Telephone Encounter (Signed)
Yes please

## 2017-05-30 ENCOUNTER — Emergency Department (HOSPITAL_BASED_OUTPATIENT_CLINIC_OR_DEPARTMENT_OTHER)
Admission: EM | Admit: 2017-05-30 | Discharge: 2017-05-30 | Disposition: A | Discharge status: Home / Self Care | Payer: Self-pay | Attending: Emergency Medicine | Admitting: Emergency Medicine

## 2017-05-30 ENCOUNTER — Emergency Department (HOSPITAL_BASED_OUTPATIENT_CLINIC_OR_DEPARTMENT_OTHER): Payer: Self-pay

## 2017-05-30 ENCOUNTER — Encounter (HOSPITAL_BASED_OUTPATIENT_CLINIC_OR_DEPARTMENT_OTHER): Payer: Self-pay | Admitting: *Deleted

## 2017-05-30 DIAGNOSIS — Z79899 Other long term (current) drug therapy: Secondary | ICD-10-CM | POA: Insufficient documentation

## 2017-05-30 DIAGNOSIS — Z7982 Long term (current) use of aspirin: Secondary | ICD-10-CM | POA: Insufficient documentation

## 2017-05-30 DIAGNOSIS — Z7984 Long term (current) use of oral hypoglycemic drugs: Secondary | ICD-10-CM | POA: Insufficient documentation

## 2017-05-30 DIAGNOSIS — E119 Type 2 diabetes mellitus without complications: Secondary | ICD-10-CM | POA: Insufficient documentation

## 2017-05-30 DIAGNOSIS — M25562 Pain in left knee: Secondary | ICD-10-CM | POA: Insufficient documentation

## 2017-05-30 HISTORY — DX: Calculus of kidney: N20.0

## 2017-05-30 MED ORDER — ACETAMINOPHEN 500 MG PO TABS: 500.0000 mg | ORAL_TABLET | Freq: Four times a day (QID) | ORAL | 0 refills | Status: AC | PRN

## 2017-05-30 MED ORDER — IBUPROFEN 400 MG PO TABS: 400.0000 mg | ORAL_TABLET | Freq: Four times a day (QID) | ORAL | 0 refills | Status: DC | PRN

## 2017-05-30 NOTE — ED Triage Notes (Signed)
Patient does not speak [place] fluently.  Daughter interprets.  States the patient has a one month history of left knee pain with radiation down anterior leg to her toes.  No known injury.  Has treated with OTC Aleve with minimal relief.

## 2017-05-30 NOTE — Discharge Instructions (Signed)
Please read and follow all provided instructions.  Your diagnoses today include:  1. Acute pain of left knee     Tests performed today include: Vital signs. See below for your results today.   Medications prescribed:  Take as prescribed   Home care instructions:  Follow any educational materials contained in this packet.  Follow-up instructions: Please follow-up with Orthopedics for further evaluation of symptoms and treatment   Return instructions:  Please return to the Emergency Department if you do not get better, if you get worse, or new symptoms OR  - Fever (temperature greater than 101.92F)  - Bleeding that does not stop with holding pressure to the area    -Severe pain (please note that you may be more sore the day after your accident)  - Chest Pain  - Difficulty breathing  - Severe nausea or vomiting  - Inability to tolerate food and liquids  - Passing out  - Skin becoming red around your wounds  - Change in mental status (confusion or lethargy)  - New numbness or weakness    Please return if you have any other emergent concerns.  Additional Information:  Your vital signs today were: BP (!) 143/82 (BP Location: Left Arm)    Pulse 66    Temp 98.3 F (36.8 C) (Oral)    Resp 20    Ht 5\' 2"  (1.575 m)    Wt 74.4 kg (164 lb)    LMP 12/02/2003    SpO2 95%    BMI 30.00 kg/m  If your blood pressure (BP) was elevated above 135/85 this visit, please have this repeated by your doctor within one month. ---------------

## 2017-05-30 NOTE — ED Notes (Signed)
ED Provider at bedside. 

## 2017-05-30 NOTE — ED Provider Notes (Signed)
[facility] Provider Note   CSN: [id number] Arrival date & time: 05/30/17  1056     History   Chief Complaint Chief Complaint  Patient presents with  . Knee Pain    left    HPI Chloe Reid is a 61 y.o. female.  HPI  61 y.o. female, presents to the Emergency Department today due to left knee pain x 1 month. Radiation down left anterior leg. Worse with ROM. Minimal at rest. No trauma or known injuries. Notes gradually worsening pain to left knee and rates 5/10. Attempted 1 Alleve yesterday with minimal relief. No numbness/tingling. No falls. No swelling. No erythema. No other symptoms noted.   Past Medical History:  Diagnosis Date  . Cataracts, bilateral   . Glaucoma   . Kidney stone   . Macular drusen   . Migraine     Patient Active Problem List   Diagnosis Date Noted  . Onychomycosis 07/09/2014  . Chest pain 03/24/2014  . Urinary incontinence 03/24/2014  . Diabetes type 2, controlled ([place]) 03/13/2014  . Hyperlipidemia 03/13/2014  . Routine general medical examination at a health care facility 03/10/2014  . Migraine 03/10/2014    Past Surgical History:  Procedure Laterality Date  . uterine biopsy      OB History    No data available       Home Medications    Prior to Admission medications   Medication Sig Start Date End Date Taking? Authorizing Provider  aspirin EC 81 MG tablet Take 81 mg by mouth daily.    [provider]  atorvastatin (LIPITOR) 80 MG tablet Take 1 tablet (80 mg total) by mouth daily. 11/03/14   Debbrah Alar, NP  Calcium Carbonate-Vitamin D (CALTRATE 600+D) 600-400 MG-UNIT per tablet Take 1 tablet by mouth daily. Patient not taking: Reported on 11/03/2014 03/31/14   Debbrah Alar, NP  meloxicam (MOBIC) 7.5 MG tablet Take 1 tablet (7.5 mg total) by mouth daily. 11/03/14   Debbrah Alar, NP  metFORMIN (GLUCOPHAGE) 500 MG tablet Take 1 tablet (500 mg total) by mouth 2 (two) times daily with a meal. 11/07/14    Debbrah Alar, NP  Multiple Vitamin (MULTIVITAMIN) tablet Take 1 tablet by mouth daily.    [provider]  terbinafine (LAMISIL) 250 MG tablet Take 1 tablet (250 mg total) by mouth daily. 11/03/14   Debbrah Alar, NP    Family History Family History  Problem Relation Age of Onset  . Heart disease Father   . Kidney disease Father     Social History Social History  Substance Use Topics  . Smoking status: Never Smoker  . Smokeless tobacco: Never Used  . Alcohol use No     Allergies   Patient has no known allergies.   Review of Systems Review of Systems  Constitutional: Negative for fever.  Musculoskeletal: Positive for arthralgias and gait problem.  Skin: Negative for rash and wound.  Neurological: Negative for numbness and headaches.   Physical Exam Updated Vital Signs BP (!) 143/82 (BP Location: Left Arm)   Pulse 66   Temp 98.3 F (36.8 C) (Oral)   Resp 20   Ht 5\' 2"  (1.575 m)   Wt 74.4 kg (164 lb)   LMP 12/02/2003   SpO2 95%   BMI 30.00 kg/m   Physical Exam  Constitutional: She is oriented to person, place, and time. Vital signs are normal. She appears well-developed and well-nourished.  HENT:  Head: Normocephalic.  Right Ear: Hearing normal.  Left Ear:  Hearing normal.  Eyes: Conjunctivae and EOM are normal. Pupils are equal, round, and reactive to light.  Cardiovascular: Normal rate and regular rhythm.   Pulmonary/Chest: Effort normal.  Musculoskeletal:  Left Knee: Negative anterior/poster drawer bilaterally. Negative ballottement test. No varus or valgus laxity. No crepitus. Pain with flexion. Positive McMurray. NVI. Distal pulses appreciated   Neurological: She is alert and oriented to person, place, and time.  Skin: Skin is warm and dry.  Psychiatric: She has a normal mood and affect. Her speech is normal and behavior is normal. Thought content normal.  Nursing note and vitals reviewed.  ED Treatments / Results  Labs (all labs  ordered are listed, but only abnormal results are displayed) Labs Reviewed - No data to display  EKG  EKG Interpretation None       Radiology Dg Knee Complete 4 Views Left  Result Date: 05/30/2017 CLINICAL DATA:  One month history of LEFT knee pain radiating down anterior leg to toes, no known injury EXAM: LEFT KNEE - COMPLETE 4+ VIEW COMPARISON:  None FINDINGS: Osseous demineralization. Joint spaces preserved. No acute fracture, dislocation, or bone destruction. No definite knee joint effusion. IMPRESSION: No acute abnormalities. Electronically Signed   By: Lavonia Dana M.D.   On: 05/30/2017 12:00    Procedures Procedures (including critical care time)  Medications Ordered in ED Medications - No data to display   Initial Impression / Assessment and Plan / ED Course  I have reviewed the triage vital signs and the nursing notes.  Pertinent labs & imaging results that were available during my care of the patient were reviewed by me and considered in my medical decision making (see chart for details).  Final Clinical Impressions(s) / ED Diagnoses   {I have reviewed and evaluated the relevant imaging studies.  {I have reviewed the relevant previous healthcare records.  {I obtained HPI from historian.   ED Course:  Assessment: Patient X-Ray negative for obvious fracture or dislocation. Possible meniscal tear given exam. Pt advised to follow up with orthopedics. Patient given brace while in ED, conservative therapy recommended and discussed. Patient will be discharged home & is agreeable with above plan. Returns precautions discussed. Pt appears safe for discharge.  Disposition/Plan:  DC Home Additional Verbal discharge instructions given and discussed with patient.  Pt Instructed to f/u with ortho in the next week for evaluation and treatment of symptoms. Return precautions given Pt acknowledges and agrees with plan  Supervising Physician Sherwood Gambler, MD  Final diagnoses:    Acute pain of left knee    New Prescriptions New Prescriptions   No medications on file     Shary Decamp, Hershal Coria 05/30/17 [place], [place], MD 06/05/17 [phone]

## 2017-06-05 ENCOUNTER — Ambulatory Visit (INDEPENDENT_AMBULATORY_CARE_PROVIDER_SITE_OTHER): Payer: Self-pay | Admitting: Orthopaedic Surgery

## 2017-06-05 ENCOUNTER — Other Ambulatory Visit (INDEPENDENT_AMBULATORY_CARE_PROVIDER_SITE_OTHER): Payer: Self-pay

## 2017-06-05 DIAGNOSIS — M25562 Pain in left knee: Secondary | ICD-10-CM | POA: Insufficient documentation

## 2017-06-05 MED ORDER — LIDOCAINE HCL 1 % IJ SOLN
2.0000 mL | INTRAMUSCULAR | Status: AC | PRN
  Administered 2017-06-05: 2 mL

## 2017-06-05 MED ORDER — METHYLPREDNISOLONE ACETATE 40 MG/ML IJ SUSP
40.0000 mg | INTRAMUSCULAR | Status: AC | PRN
Start: 2017-06-05 — End: 2017-06-05
  Administered 2017-06-05: 40 mg via INTRA_ARTICULAR

## 2017-06-05 MED ORDER — BUPIVACAINE HCL 0.5 % IJ SOLN
2.0000 mL | INTRAMUSCULAR | Status: AC | PRN
  Administered 2017-06-05: 2 mL via INTRA_ARTICULAR

## 2017-06-05 NOTE — Progress Notes (Signed)
Office Visit Note   Patient: Chloe Reid           Date of Birth: 11/14/1956           MRN: [id number] Visit Date: 06/05/2017              Requested by: No referring provider defined for this encounter. PCP: Patient, No Pcp Per   Assessment & Plan: Visit Diagnoses:  1. Acute pain of left knee     Plan: Left knee injection was performed today. Would also like to get a MRI to rule out a stress fracture since she is presenting with a lot of pain. She may also have a degenerative meniscal tear. Follow-up after the MRI  Follow-Up Instructions: Return in about 1 week (around 06/12/2017).   Orders:  No orders of the defined types were placed in this encounter.  No orders of the defined types were placed in this encounter.     Procedures: Large Joint Inj Date/Time: 06/05/2017 7:44 PM Performed by: Leandrew Koyanagi Authorized by: Leandrew Koyanagi   Consent Given by:  Patient Timeout: prior to procedure the correct patient, procedure, and site was verified   Indications:  Pain Location:  Knee Site:  L knee Prep: patient was prepped and draped in usual sterile fashion   Needle Size:  22 G Ultrasound Guidance: No   Fluoroscopic Guidance: No   Arthrogram: No   Medications:  2 mL lidocaine 1 %; 2 mL bupivacaine 0.5 %; 40 mg methylPREDNISolone acetate 40 MG/ML Patient tolerance:  Patient tolerated the procedure well with no immediate complications     Clinical Data: No additional findings.   Subjective: No chief complaint on file.   Patient is a 61 year old female comes in with severe left knee pain for approximately 1 month that is worse with ambulation. She feels like the knee wants to give out. She denies any swelling. Denies any injuries. Denies any instability. Pain does not radiate.    Review of Systems  Constitutional: Negative.   HENT: Negative.   Eyes: Negative.   Respiratory: Negative.   Cardiovascular: Negative.   Endocrine: Negative.   Musculoskeletal: Negative.    Neurological: Negative.   Hematological: Negative.   Psychiatric/Behavioral: Negative.   All other systems reviewed and are negative.    Objective: Vital Signs: LMP 12/02/2003   Physical Exam  Constitutional: She is oriented to person, place, and time. She appears well-developed and well-nourished.  HENT:  Head: Normocephalic and atraumatic.  Eyes: EOM are normal.  Neck: Neck supple.  Pulmonary/Chest: Effort normal.  Abdominal: Soft.  Neurological: She is alert and oriented to person, place, and time.  Skin: Skin is warm. Capillary refill takes less than 2 seconds.  Psychiatric: She has a normal mood and affect. Her behavior is normal. Judgment and thought content normal.  Nursing note and vitals reviewed.   Ortho Exam Left knee exam shows no joint effusion. Pain with McMurray testing at the medial joint line. Medial tibial plateau is tender. Collaterals and cruciate's are stable. Specialty Comments:  No specialty comments available.  Imaging: No results found.   PMFS History: Patient Active Problem List   Diagnosis Date Noted  . Acute pain of left knee 06/05/2017  . Onychomycosis 07/09/2014  . Chest pain 03/24/2014  . Urinary incontinence 03/24/2014  . Diabetes type 2, controlled ([place]) 03/13/2014  . Hyperlipidemia 03/13/2014  . Routine general medical examination at a health care facility 03/10/2014  . Migraine 03/10/2014   Past Medical  History:  Diagnosis Date  . Cataracts, bilateral   . Glaucoma   . Kidney stone   . Macular drusen   . Migraine     Family History  Problem Relation Age of Onset  . Heart disease Father   . Kidney disease Father     Past Surgical History:  Procedure Laterality Date  . uterine biopsy     Social History   Occupational History  . Not on file.   Social History Main Topics  . Smoking status: Never Smoker  . Smokeless tobacco: Never Used  . Alcohol use No  . Drug use: Unknown  . Sexual activity: Not on file

## 2017-06-11 ENCOUNTER — Ambulatory Visit
Admission: RE | Admit: 2017-06-11 | Discharge: 2017-06-11 | Disposition: A | Discharge status: Home / Self Care | Payer: Self-pay | Source: Ambulatory Visit | Attending: Orthopaedic Surgery | Admitting: Orthopaedic Surgery

## 2017-06-11 ENCOUNTER — Ambulatory Visit (INDEPENDENT_AMBULATORY_CARE_PROVIDER_SITE_OTHER): Payer: Self-pay | Admitting: Orthopaedic Surgery

## 2017-06-11 DIAGNOSIS — M25562 Pain in left knee: Secondary | ICD-10-CM

## 2017-06-12 ENCOUNTER — Ambulatory Visit (INDEPENDENT_AMBULATORY_CARE_PROVIDER_SITE_OTHER): Payer: Self-pay | Admitting: Orthopaedic Surgery

## 2017-06-15 ENCOUNTER — Ambulatory Visit (INDEPENDENT_AMBULATORY_CARE_PROVIDER_SITE_OTHER): Payer: Self-pay | Admitting: Orthopaedic Surgery

## 2017-06-15 ENCOUNTER — Encounter (INDEPENDENT_AMBULATORY_CARE_PROVIDER_SITE_OTHER): Payer: Self-pay | Admitting: Orthopaedic Surgery

## 2017-06-15 DIAGNOSIS — S82135A Nondisplaced fracture of medial condyle of left tibia, initial encounter for closed fracture: Secondary | ICD-10-CM

## 2017-06-15 MED ORDER — CALCIUM CARBONATE-VITAMIN D 500-200 MG-UNIT PO TABS
1.0000 | ORAL_TABLET | Freq: Three times a day (TID) | ORAL | 12 refills | Status: AC
Start: 2017-06-15 — End: ?

## 2017-06-15 NOTE — Progress Notes (Signed)
Office Visit Note   Patient: Chloe Reid           Date of Birth: 03-15-1956           MRN: [id number] Visit Date: 06/15/2017              Requested by: No referring provider defined for this encounter. PCP: Patient, No Pcp Per   Assessment & Plan: Visit Diagnoses:  1. Closed nondisplaced fracture of medial condyle of left tibia, initial encounter     Plan: MRI shows a small radial tear of the medial meniscus with an insufficiency fracture of the medial tibial plateau with significant bony edema. Patient is overall doing better. I recommend ambulation with a cane to help offload distress. Continue wearing the knee brace. NSAIDs as needed. Calcium and vitamin D were prescribed. Follow-up as needed.  Follow-Up Instructions: Return if symptoms worsen or fail to improve.   Orders:  No orders of the defined types were placed in this encounter.  Meds ordered this encounter  Medications  . calcium-vitamin D (OSCAL WITH D) 500-200 MG-UNIT tablet    Sig: Take 1 tablet by mouth 3 (three) times daily.    Dispense:  90 tablet    Refill:  12      Procedures: No procedures performed   Clinical Data: No additional findings.   Subjective: Chief Complaint  Patient presents with  . Left Knee - Follow-up, Pain    Patient follows up today to review her MRI. She states that she is feeling better. She is taking ibuprofen and Tylenol. She is not taking anything in the last day. Injection did not really help.    Review of Systems  Constitutional: Negative.   HENT: Negative.   Eyes: Negative.   Respiratory: Negative.   Cardiovascular: Negative.   Endocrine: Negative.   Musculoskeletal: Negative.   Neurological: Negative.   Hematological: Negative.   Psychiatric/Behavioral: Negative.   All other systems reviewed and are negative.    Objective: Vital Signs: LMP 12/02/2003   Physical Exam  Constitutional: She is oriented to person, place, and time. She appears well-developed  and well-nourished.  Pulmonary/Chest: Effort normal.  Neurological: She is alert and oriented to person, place, and time.  Skin: Skin is warm. Capillary refill takes less than 2 seconds.  Psychiatric: She has a normal mood and affect. Her behavior is normal. Judgment and thought content normal.  Nursing note and vitals reviewed.   Ortho Exam Left knee exam shows no joint effusion. Moderate tenderness along the medial tibial plateau. Specialty Comments:  No specialty comments available.  Imaging: No results found.   PMFS History: Patient Active Problem List   Diagnosis Date Noted  . Acute pain of left knee 06/05/2017  . Onychomycosis 07/09/2014  . Chest pain 03/24/2014  . Urinary incontinence 03/24/2014  . Diabetes type 2, controlled ([place]) 03/13/2014  . Hyperlipidemia 03/13/2014  . Routine general medical examination at a health care facility 03/10/2014  . Migraine 03/10/2014   Past Medical History:  Diagnosis Date  . Cataracts, bilateral   . Glaucoma   . Kidney stone   . Macular drusen   . Migraine     Family History  Problem Relation Age of Onset  . Heart disease Father   . Kidney disease Father     Past Surgical History:  Procedure Laterality Date  . uterine biopsy     Social History   Occupational History  . Not on file.   Social History Main Topics  .  Smoking status: Never Smoker  . Smokeless tobacco: Never Used  . Alcohol use No  . Drug use: Unknown  . Sexual activity: Not on file

## 2017-06-17 ENCOUNTER — Ambulatory Visit: Payer: 59 | Admitting: Family Medicine

## 2017-06-18 ENCOUNTER — Ambulatory Visit: Payer: Self-pay | Attending: Family Medicine | Admitting: Family Medicine

## 2017-06-18 ENCOUNTER — Encounter: Payer: Self-pay | Admitting: Family Medicine

## 2017-06-18 VITALS — BP 120/75 | HR 69 | Temp 97.4°F | Resp 18 | Ht 62.0 in | Wt 162.0 lb

## 2017-06-18 DIAGNOSIS — X58XXXS Exposure to other specified factors, sequela: Secondary | ICD-10-CM | POA: Insufficient documentation

## 2017-06-18 DIAGNOSIS — Z7984 Long term (current) use of oral hypoglycemic drugs: Secondary | ICD-10-CM | POA: Insufficient documentation

## 2017-06-18 DIAGNOSIS — S82135S Nondisplaced fracture of medial condyle of left tibia, sequela: Secondary | ICD-10-CM | POA: Insufficient documentation

## 2017-06-18 DIAGNOSIS — E119 Type 2 diabetes mellitus without complications: Secondary | ICD-10-CM | POA: Insufficient documentation

## 2017-06-18 DIAGNOSIS — IMO0001 Reserved for inherently not codable concepts without codable children: Secondary | ICD-10-CM

## 2017-06-18 DIAGNOSIS — E1165 Type 2 diabetes mellitus with hyperglycemia: Secondary | ICD-10-CM

## 2017-06-18 LAB — GLUCOSE, POCT (MANUAL RESULT ENTRY): POC Glucose: 105 mg/dl — AB (ref 70–99)

## 2017-06-18 LAB — POCT UA - MICROALBUMIN
CREATININE, POC: 50 mg/dL
MICROALBUMIN (UR) POC: 10 mg/L

## 2017-06-18 LAB — POCT GLYCOSYLATED HEMOGLOBIN (HGB A1C): Hemoglobin A1C: 6.4

## 2017-06-18 MED ORDER — KNEE BRACE MISC: 1.0000 "application " | Freq: Once | 0 refills | Status: AC

## 2017-06-18 MED ORDER — GLUCOSE BLOOD VI STRP: ORAL_STRIP | 12 refills | Status: DC

## 2017-06-18 MED ORDER — IBUPROFEN 600 MG PO TABS: 600.0000 mg | ORAL_TABLET | Freq: Three times a day (TID) | ORAL | 0 refills | Status: DC | PRN

## 2017-06-18 MED ORDER — TRUE METRIX METER W/DEVICE KIT: 1.0000 | PACK | Freq: Once | 0 refills | Status: DC

## 2017-06-18 MED ORDER — METFORMIN HCL 500 MG PO TABS: 500.0000 mg | ORAL_TABLET | Freq: Every day | ORAL | 2 refills | Status: DC

## 2017-06-18 MED ORDER — TRUEPLUS LANCETS 28G MISC: 1.0000 | Freq: Once | 12 refills | Status: AC

## 2017-06-18 MED ORDER — TRUEPLUS LANCETS 28G MISC: 1.0000 | Freq: Once | 12 refills | Status: DC

## 2017-06-18 MED ORDER — TRUE METRIX METER W/DEVICE KIT: 1.0000 | PACK | Freq: Once | 0 refills | Status: AC

## 2017-06-18 NOTE — Progress Notes (Signed)
Subjective:  Patient ID: Chloe Reid, female    DOB: 03-Mar-1956  Age: 61 y.o. MRN: [id number]  CC: Knee Pain   HPI Chloe Reid presents for complains of arthralgias for which has been present for 2 months. Pain is located in the left knee(s), is described as aching, and is constant .  Associated symptoms include: decreased range of motion. Symptoms aggravated with walking stairs. Pain 5 out of 10. Related to injury: Yes. Recent history of orthopedic visit 7/16. History of radial tear of medial meniscus w/ insufficiency fracture of medial tibial plateau with significant bony edema. History of diabetes. Symptoms: none. Patient denies foot ulcerations, nausea, paresthesia of the feet, polydipsia, polyuria, visual disturbances and vomitting.  Evaluation to date has been included: hemoglobin A1C.  Home sugars: patient does not check sugars. Treatment to date: metformin.     Outpatient Medications Prior to Visit  Medication Sig Dispense Refill  . acetaminophen (TYLENOL) 500 MG tablet Take 1 tablet (500 mg total) by mouth every 6 (six) hours as needed. 30 tablet 0  . calcium-vitamin D (OSCAL WITH D) 500-200 MG-UNIT tablet Take 1 tablet by mouth 3 (three) times daily. 90 tablet 12  . ibuprofen (ADVIL,MOTRIN) 400 MG tablet Take 1 tablet (400 mg total) by mouth every 6 (six) hours as needed. 30 tablet 0   No facility-administered medications prior to visit.     ROS Review of Systems  Constitutional: Negative.   Eyes: Negative.   Respiratory: Negative.   Cardiovascular: Negative.   Gastrointestinal: Negative.   Musculoskeletal: Positive for arthralgias.  Skin: Negative.     Objective:  BP 120/75 (BP Location: Right Arm, Patient Position: Sitting, Cuff Size: Normal)   Pulse 69   Temp (!) 97.4 F (36.3 C) (Oral)   Resp 18   Ht 5' 2"  (1.575 m)   Wt 162 lb (73.5 kg)   LMP 12/02/2003   SpO2 98%   BMI 29.63 kg/m   BP/Weight 06/18/2017 05/30/2017 02/06/8827  Systolic BP 003 491 791  Diastolic  BP 75 86 70  Wt. (Lbs) 162 164 156.2  BMI 29.63 30 28.56   Physical Exam  Constitutional: She appears well-developed and well-nourished.  Eyes: Pupils are equal, round, and reactive to light. Conjunctivae are normal.  Neck: No JVD present.  Cardiovascular: Normal rate, regular rhythm, normal heart sounds and intact distal pulses.   Pulmonary/Chest: Effort normal and breath sounds normal.  Abdominal: Soft. Bowel sounds are normal.  Musculoskeletal:       Left knee: She exhibits decreased range of motion and bony tenderness.  Pain with knee flexion.  Skin: Skin is warm and dry.  Nursing note and vitals reviewed.   Assessment & Plan:   Problem List Items Addressed This Visit    None    Visit Diagnoses    Uncontrolled type 2 diabetes mellitus without complication, without long-term current use of insulin (HCC)    -  Primary   Relevant Medications   metFORMIN (GLUCOPHAGE) 500 MG tablet   glucose blood test strip   Other Relevant Orders   Glucose (CBG) (Completed)   POCT glycosylated hemoglobin (Hb A1C) (Completed)   CMP14+EGFR (Completed)   Lipid Panel (Completed)   POCT UA - Microalbumin (Completed)   Closed nondisplaced fracture of medial condyle of left tibia, sequela          Meds ordered this encounter  Medications  . metFORMIN (GLUCOPHAGE) 500 MG tablet    Sig: Take 1 tablet (500 mg total) by mouth  daily with breakfast.    Dispense:  30 tablet    Refill:  2    Order Specific Question:   Supervising Provider    Answer:   Tresa Garter [id number]  . DISCONTD: TRUEPLUS LANCETS 28G MISC    Sig: 1 kit by Does not apply route once.    Dispense:  100 each    Refill:  12    Order Specific Question:   Supervising Provider    Answer:   Tresa Garter [id number]  . DISCONTD: Blood Glucose Monitoring Suppl (TRUE METRIX METER) w/Device KIT    Sig: 1 Device by Does not apply route once.    Dispense:  1 kit    Refill:  0    Order Specific Question:   Supervising  Provider    Answer:   Tresa Garter [id number]  . DISCONTD: glucose blood test strip    Sig: Use as instructed    Dispense:  100 each    Refill:  12    Order Specific Question:   Supervising Provider    Answer:   Tresa Garter [[id number]]  . Elastic Bandages & Supports (KNEE BRACE) MISC    Sig: 1 application by Does not apply route once. Apply brace to left knee for stability and support. To be fitted by medical supply.    Dispense:  1 each    Refill:  0    Order Specific Question:   Supervising Provider    Answer:   Tresa Garter [id number]  . ibuprofen (ADVIL,MOTRIN) 600 MG tablet    Sig: Take 1 tablet (600 mg total) by mouth every 8 (eight) hours as needed (Take with food.).    Dispense:  30 tablet    Refill:  0    Order Specific Question:   Supervising Provider    Answer:   Tresa Garter [id number]  . TRUEPLUS LANCETS 28G MISC    Sig: 1 kit by Does not apply route once.    Dispense:  100 each    Refill:  12    Order Specific Question:   Supervising Provider    Answer:   Tresa Garter [id number]  . Blood Glucose Monitoring Suppl (TRUE METRIX METER) w/Device KIT    Sig: 1 Device by Does not apply route once.    Dispense:  1 kit    Refill:  0    Order Specific Question:   Supervising Provider    Answer:   Tresa Garter [id number]  . glucose blood test strip    Sig: Use as instructed    Dispense:  100 each    Refill:  12    Order Specific Question:   Supervising Provider    Answer:   Tresa Garter [[id number]]    Follow-up: Return in about 8 weeks (around 08/13/2017) for Diabetes.   Alfonse Spruce FNP

## 2017-06-18 NOTE — Patient Instructions (Signed)

## 2017-06-19 LAB — LIPID PANEL
CHOLESTEROL TOTAL: 250 mg/dL — AB (ref 100–199)
Chol/HDL Ratio: 4 ratio (ref 0.0–4.4)
HDL: 63 mg/dL (ref >39)
LDL Calculated: 138 mg/dL — ABNORMAL HIGH (ref 0–99)
TRIGLYCERIDES: 247 mg/dL — AB (ref 0–149)
VLDL Cholesterol Cal: 49 mg/dL — ABNORMAL HIGH (ref 5–40)

## 2017-06-19 LAB — CMP14+EGFR
ALK PHOS: 90 IU/L (ref 39–117)
ALT: 22 IU/L (ref 0–32)
AST: 22 IU/L (ref 0–40)
Albumin/Globulin Ratio: 1.3 (ref 1.2–2.2)
Albumin: 4.3 g/dL (ref 3.6–4.8)
BUN/Creatinine Ratio: 30 — ABNORMAL HIGH (ref 12–28)
BUN: 20 mg/dL (ref 8–27)
Bilirubin Total: 0.2 mg/dL (ref 0.0–1.2)
CALCIUM: 9.9 mg/dL (ref 8.7–10.3)
CO2: 20 mmol/L (ref 20–29)
CREATININE: 0.67 mg/dL (ref 0.57–1.00)
Chloride: 101 mmol/L (ref 96–106)
GFR calc Af Amer: 108 mL/min/{1.73_m2} (ref >59)
GFR calc non Af Amer: 94 mL/min/{1.73_m2} (ref >59)
GLOBULIN, TOTAL: 3.3 g/dL (ref 1.5–4.5)
GLUCOSE: 90 mg/dL (ref 65–99)
Potassium: 4.8 mmol/L (ref 3.5–5.2)
SODIUM: 138 mmol/L (ref 134–144)
Total Protein: 7.6 g/dL (ref 6.0–8.5)

## 2017-06-23 ENCOUNTER — Other Ambulatory Visit: Payer: Self-pay | Admitting: Family Medicine

## 2017-06-23 DIAGNOSIS — E782 Mixed hyperlipidemia: Secondary | ICD-10-CM

## 2017-06-23 MED ORDER — ATORVASTATIN CALCIUM 20 MG PO TABS
20.0000 mg | ORAL_TABLET | Freq: Every day | ORAL | 2 refills | Status: DC
Start: 2017-06-23 — End: 2020-11-14

## 2017-07-13 ENCOUNTER — Telehealth: Payer: Self-pay

## 2017-07-13 NOTE — Telephone Encounter (Signed)
-----   Message from Alfonse Spruce, [place] sent at 06/23/2017  2:43 PM EDT ----- -Lipid levels were elevated. This can increase your risk of heart disease. You will be prescribed atorvastatin to help lower risk. Recommend recheck in 3 months. Kidney function normal Liver function normal

## 2017-07-13 NOTE — Telephone Encounter (Signed)
CMA call regarding lab results   Patient Verify DOB   Patient was aware and understood  

## 2017-08-13 ENCOUNTER — Ambulatory Visit: Payer: Self-pay | Admitting: Family Medicine

## 2018-01-14 ENCOUNTER — Ambulatory Visit: Payer: Self-pay

## 2018-01-17 ENCOUNTER — Emergency Department (HOSPITAL_BASED_OUTPATIENT_CLINIC_OR_DEPARTMENT_OTHER): Payer: Self-pay

## 2018-01-17 ENCOUNTER — Other Ambulatory Visit: Payer: Self-pay

## 2018-01-17 ENCOUNTER — Emergency Department (HOSPITAL_BASED_OUTPATIENT_CLINIC_OR_DEPARTMENT_OTHER)
Admission: EM | Admit: 2018-01-17 | Discharge: 2018-01-17 | Disposition: A | Discharge status: Home / Self Care | Payer: Self-pay | Attending: Emergency Medicine | Admitting: Emergency Medicine

## 2018-01-17 ENCOUNTER — Encounter (HOSPITAL_BASED_OUTPATIENT_CLINIC_OR_DEPARTMENT_OTHER): Payer: Self-pay | Admitting: Emergency Medicine

## 2018-01-17 DIAGNOSIS — E119 Type 2 diabetes mellitus without complications: Secondary | ICD-10-CM | POA: Insufficient documentation

## 2018-01-17 DIAGNOSIS — N3090 Cystitis, unspecified without hematuria: Secondary | ICD-10-CM | POA: Insufficient documentation

## 2018-01-17 DIAGNOSIS — M5432 Sciatica, left side: Secondary | ICD-10-CM | POA: Insufficient documentation

## 2018-01-17 DIAGNOSIS — Z79899 Other long term (current) drug therapy: Secondary | ICD-10-CM | POA: Insufficient documentation

## 2018-01-17 DIAGNOSIS — Z7984 Long term (current) use of oral hypoglycemic drugs: Secondary | ICD-10-CM | POA: Insufficient documentation

## 2018-01-17 DIAGNOSIS — R21 Rash and other nonspecific skin eruption: Secondary | ICD-10-CM | POA: Insufficient documentation

## 2018-01-17 DIAGNOSIS — N3000 Acute cystitis without hematuria: Secondary | ICD-10-CM

## 2018-01-17 LAB — URINALYSIS, MICROSCOPIC (REFLEX): RBC / HPF: NONE SEEN RBC/hpf (ref 0–5)

## 2018-01-17 LAB — BASIC METABOLIC PANEL
ANION GAP: 5 (ref 5–15)
BUN: 15 mg/dL (ref 6–20)
CALCIUM: 8.8 mg/dL — AB (ref 8.9–10.3)
CO2: 23 mmol/L (ref 22–32)
CREATININE: 0.7 mg/dL (ref 0.44–1.00)
Chloride: 106 mmol/L (ref 101–111)
GFR calc non Af Amer: >60 mL/min (ref >60)
Glucose, Bld: 103 mg/dL — ABNORMAL HIGH (ref 65–99)
Potassium: 4.1 mmol/L (ref 3.5–5.1)
SODIUM: 134 mmol/L — AB (ref 135–145)

## 2018-01-17 LAB — CBC WITH DIFFERENTIAL/PLATELET
BASOS PCT: 0 %
Basophils Absolute: 0 10*3/uL (ref 0.0–0.1)
EOS ABS: 0.2 10*3/uL (ref 0.0–0.7)
Eosinophils Relative: 2 %
HEMATOCRIT: 37.8 % (ref 36.0–46.0)
HEMOGLOBIN: 12.2 g/dL (ref 12.0–15.0)
Lymphocytes Relative: 40 %
Lymphs Abs: 3.7 10*3/uL (ref 0.7–4.0)
MCH: 24.4 pg — ABNORMAL LOW (ref 26.0–34.0)
MCHC: 32.3 g/dL (ref 30.0–36.0)
MCV: 75.4 fL — ABNORMAL LOW (ref 78.0–100.0)
MONOS PCT: 8 %
Monocytes Absolute: 0.7 10*3/uL (ref 0.1–1.0)
NEUTROS ABS: 4.7 10*3/uL (ref 1.7–7.7)
NEUTROS PCT: 50 %
Platelets: 293 10*3/uL (ref 150–400)
RBC: 5.01 MIL/uL (ref 3.87–5.11)
RDW: 15.5 % (ref 11.5–15.5)
WBC: 9.2 10*3/uL (ref 4.0–10.5)

## 2018-01-17 LAB — URINALYSIS, ROUTINE W REFLEX MICROSCOPIC
BILIRUBIN URINE: NEGATIVE
Glucose, UA: NEGATIVE mg/dL
Hgb urine dipstick: NEGATIVE
KETONES UR: NEGATIVE mg/dL
NITRITE: NEGATIVE
PH: 5.5 (ref 5.0–8.0)
PROTEIN: NEGATIVE mg/dL
Specific Gravity, Urine: 1.025 (ref 1.005–1.030)

## 2018-01-17 MED ORDER — OXYCODONE-ACETAMINOPHEN 5-325 MG PO TABS
1.0000 | ORAL_TABLET | Freq: Once | ORAL | Status: AC
  Administered 2018-01-17: 1 via ORAL
  Filled 2018-01-17: qty 1

## 2018-01-17 MED ORDER — CEPHALEXIN 500 MG PO CAPS: 500.0000 mg | ORAL_CAPSULE | Freq: Two times a day (BID) | ORAL | 0 refills | Status: AC

## 2018-01-17 MED ORDER — METHOCARBAMOL 500 MG PO TABS: 500.0000 mg | ORAL_TABLET | Freq: Every evening | ORAL | 0 refills | Status: DC | PRN

## 2018-01-17 MED ORDER — NAPROXEN 500 MG PO TABS: 500.0000 mg | ORAL_TABLET | Freq: Two times a day (BID) | ORAL | 0 refills | Status: DC

## 2018-01-17 NOTE — ED Triage Notes (Signed)
Patient has a rash to her left flank and down into her left leg. The patient is also having pain in that region

## 2018-01-17 NOTE — ED Provider Notes (Signed)
[facility] EMERGENCY DEPARTMENT Provider Note   CSN: [id number] Arrival date & time: 01/17/18  1444     History   Chief Complaint Chief Complaint  Patient presents with  . Rash    flank pain    HPI Chloe Reid is a 62 y.o. female with past medical history significant for nephrolithiasis, urinary incontinence, diabetes, hyperlipidemia presenting with left hip/buttocks pain radiating into her groin.  Patient has a skin eruption overlying area but she denies any pain superficially.  Reports that her pain is deep and feels like pins and needles radiating down her left leg and groin. Denies any fever, chills, nausea, vomiting, diarrhea.  No urinary symptoms.  Patient has known urinary incontinence.  Reports fully emptying her bladder when she uses the bathroom.  No hip pain with weightbearing.  Ambulating without difficulties.  Her pain is constant has kept her from sleeping.   HPI  Past Medical History:  Diagnosis Date  . Cataracts, bilateral   . Glaucoma   . Kidney stone   . Macular drusen   . Migraine     Patient Active Problem List   Diagnosis Date Noted  . Acute pain of left knee 06/05/2017  . Onychomycosis 07/09/2014  . Chest pain 03/24/2014  . Urinary incontinence 03/24/2014  . Diabetes type 2, controlled ([place]) 03/13/2014  . Hyperlipidemia 03/13/2014  . Routine general medical examination at a health care facility 03/10/2014  . Migraine 03/10/2014    Past Surgical History:  Procedure Laterality Date  . uterine biopsy      OB History    No data available       Home Medications    Prior to Admission medications   Medication Sig Start Date End Date Taking? Authorizing Provider  acetaminophen (TYLENOL) 500 MG tablet Take 1 tablet (500 mg total) by mouth every 6 (six) hours as needed. 05/30/17   Shary Decamp, PA-C  atorvastatin (LIPITOR) 20 MG tablet Take 1 tablet (20 mg total) by mouth daily. 06/23/17   Alfonse Spruce, FNP  calcium-vitamin D  (OSCAL WITH D) 500-200 MG-UNIT tablet Take 1 tablet by mouth 3 (three) times daily. 06/15/17   Leandrew Koyanagi, MD  cephALEXin (KEFLEX) 500 MG capsule Take 1 capsule (500 mg total) by mouth 2 (two) times daily for 7 days. 01/17/18 01/24/18  Avie Echevaria B, PA-C  glucose blood test strip Use as instructed 06/18/17   Alfonse Spruce, FNP  ibuprofen (ADVIL,MOTRIN) 600 MG tablet Take 1 tablet (600 mg total) by mouth every 8 (eight) hours as needed (Take with food.). 06/18/17   Alfonse Spruce, FNP  metFORMIN (GLUCOPHAGE) 500 MG tablet Take 1 tablet (500 mg total) by mouth daily with breakfast. 06/18/17   Alfonse Spruce, FNP  naproxen (NAPROSYN) 500 MG tablet Take 1 tablet (500 mg total) by mouth 2 (two) times daily. 01/17/18   Emeline General, PA-C    Family History Family History  Problem Relation Age of Onset  . Heart disease Father   . Kidney disease Father     Social History Social History   Tobacco Use  . Smoking status: Never Smoker  . Smokeless tobacco: Never Used  Substance Use Topics  . Alcohol use: No  . Drug use: Not on file     Allergies   Patient has no known allergies.   Review of Systems Review of Systems  Constitutional: Negative for chills, diaphoresis, fatigue and fever.  HENT: Negative for congestion.   Respiratory: Negative  for cough, chest tightness, shortness of breath, wheezing and stridor.   Cardiovascular: Negative for chest pain, palpitations and leg swelling.  Gastrointestinal: Negative for abdominal distention, abdominal pain, blood in stool, diarrhea, nausea and vomiting.  Genitourinary: Positive for flank pain. Negative for decreased urine volume, difficulty urinating, dysuria, frequency, hematuria, pelvic pain, vaginal bleeding and vaginal discharge.       Reporting mild left flank swelling and discomfort  Musculoskeletal: Positive for myalgias. Negative for gait problem, neck pain and neck stiffness.  Skin: Positive for rash.  Negative for color change and pallor.       Patient with nonpruritic nonpainful macular rash lower back midline and left lower back  Neurological: Negative for dizziness, weakness, light-headedness, numbness and headaches.  Psychiatric/Behavioral: Negative for behavioral problems.     Physical Exam Updated Vital Signs BP (!) 154/83 (BP Location: Right Arm)   Pulse 62   Temp 98 F (36.7 C) (Oral)   Resp 18   Ht 5\' 4"  (1.626 m)   Wt 73.5 kg (162 lb)   LMP 12/02/2003   SpO2 99%   BMI 27.81 kg/m   Physical Exam  Constitutional: She appears well-developed and well-nourished. No distress.  Nontoxic appearing in no acute distress, sitting comfortably in bed  HENT:  Head: Atraumatic.  Eyes: EOM are normal.  Neck: Normal range of motion.  Cardiovascular: Normal rate, regular rhythm and normal heart sounds.  Pulmonary/Chest: Effort normal and breath sounds normal. No stridor. No respiratory distress. She has no wheezes. She has no rales.  Abdominal: She exhibits no distension and no mass. There is tenderness. There is no rebound and no guarding.  Mild discomfort to palpation of the left lower quadrant  Musculoskeletal: Normal range of motion.  Neurological: She is alert.  Skin: Skin is warm. Rash noted. She is not diaphoretic. No erythema. No pallor.  Psychiatric: She has a normal mood and affect. Her behavior is normal.  Nursing note and vitals reviewed.    ED Treatments / Results  Labs (all labs ordered are listed, but only abnormal results are displayed) Labs Reviewed  BASIC METABOLIC PANEL - Abnormal; Notable for the following components:      Result Value   Sodium 134 (*)    Glucose, Bld 103 (*)    Calcium 8.8 (*)    All other components within normal limits  URINALYSIS, ROUTINE W REFLEX MICROSCOPIC - Abnormal; Notable for the following components:   Color, Urine STRAW (*)    APPearance CLOUDY (*)    Leukocytes, UA MODERATE (*)    All other components within normal  limits  CBC WITH DIFFERENTIAL/PLATELET - Abnormal; Notable for the following components:   MCV 75.4 (*)    MCH 24.4 (*)    All other components within normal limits  URINALYSIS, MICROSCOPIC (REFLEX) - Abnormal; Notable for the following components:   Bacteria, UA MANY (*)    Squamous Epithelial / LPF 6-30 (*)    All other components within normal limits  URINE CULTURE    EKG  EKG Interpretation None       Radiology Ct Renal Stone Study  Result Date: 01/17/2018 CLINICAL DATA:  Flank pain left-sided EXAM: CT ABDOMEN AND PELVIS WITHOUT CONTRAST TECHNIQUE: Multidetector CT imaging of the abdomen and pelvis was performed following the standard protocol without IV contrast. COMPARISON:  None. FINDINGS: Lower chest: Lung bases demonstrate no acute consolidation or effusion. Heart size within normal limits. Hepatobiliary: No focal hepatic abnormality. No calcified gallstones. No biliary dilatation. Pancreas:  Unremarkable. No pancreatic ductal dilatation or surrounding inflammatory changes. Spleen: Normal in size without focal abnormality. Adrenals/Urinary Tract: Adrenal glands are unremarkable. Kidneys are normal, without renal calculi, focal lesion, or hydronephrosis. Bladder is unremarkable. Stomach/Bowel: Stomach is within normal limits. Appendix appears normal. No evidence of bowel wall thickening, distention, or inflammatory changes. Vascular/Lymphatic: Mild aortic atherosclerosis. No aneurysmal dilatation. No significantly enlarged lymph nodes. Reproductive: Uterus and bilateral adnexa are unremarkable. Other: Negative for free air or free fluid. Musculoskeletal: No acute or significant osseous findings. IMPRESSION: 1. No CT evidence for nephrolithiasis, hydronephrosis or ureteral stone 2. There are no acute abnormalities visualized. Electronically Signed   By: Donavan Foil M.D.   On: 01/17/2018 18:20    Procedures Procedures (including critical care time)  Medications Ordered in  ED Medications  oxyCODONE-acetaminophen (PERCOCET/ROXICET) 5-325 MG per tablet 1 tablet (1 tablet Oral Given 01/17/18 1840)     Initial Impression / Assessment and Plan / ED Course  I have reviewed the triage vital signs and the nursing notes.  Pertinent labs & imaging results that were available during my care of the patient were reviewed by me and considered in my medical decision making (see chart for details).    Patient presenting with left flank pain, left buttocks pain radiating into the groin.   Ct renal without evidence of lithiasis or other intra abdominal abnormalities.  Patient denies any urinary symptoms Urine analysis showing signs of UTI.  Given the back pain will treat for UTI. urine was sent for culture.  On reassessment, patient improved after pain medication. Her hx and exam are also consistent with sciatic pain.  Discharge home with symptomatic relief and close follow-up with PCP.  Discussed strict return precautions and advised to return to the emergency department if experiencing any new or worsening symptoms. Instructions were understood and patient agreed with discharge plan.  Final Clinical Impressions(s) / ED Diagnoses   Final diagnoses:  Rash  Acute cystitis without hematuria    ED Discharge Orders        Ordered    cephALEXin (KEFLEX) 500 MG capsule  2 times daily     01/17/18 1939    naproxen (NAPROSYN) 500 MG tablet  2 times daily     01/17/18 1939       Dossie Der 01/17/18 1952    Sherwood Gambler, MD 01/18/18 [phone]

## 2018-01-17 NOTE — ED Notes (Signed)
ED Provider at bedside. 

## 2018-01-17 NOTE — ED Notes (Signed)
Given snack and po fluids , daughter at bedside

## 2018-01-17 NOTE — ED Notes (Signed)
Lab reports they do not have enough urine for a culture. Pt and FM advised. They also report pt states she is dizzy. Instructed not to get up by herself, but to have FM call staff. Given instructions for specimen.

## 2018-01-17 NOTE — Discharge Instructions (Signed)
As discussed, make sure that you stay well-hydrated drinking enough fluids to keep urine clear. Take your entire course of antibiotics even if you feel better.  Apply ice or heat to your buttocks to help with pain and take naproxen as needed. Robaxin at night time.  Follow up with your primary care provider.  Return sooner if symptoms worsen or new concerning symptoms in the meantime.

## 2018-01-19 LAB — URINE CULTURE: Culture: NO GROWTH

## 2019-02-08 IMAGING — CT CT RENAL STONE PROTOCOL
2 of 4 series · 17 of 46 positions shown, 19 images · non-contrast
Comparison: None.

CLINICAL DATA: Flank pain left-sided

EXAM:
CT ABDOMEN AND PELVIS WITHOUT CONTRAST
TECHNIQUE: Multidetector CT imaging of the abdomen and pelvis was performed
following the standard protocol without IV contrast.

[Series 2: axial st · axial · 0.76mm/px · z∈[-477,-82]mm · 14 of 87 slices shown, 16 images]
[im 4/87  soft-tissue]
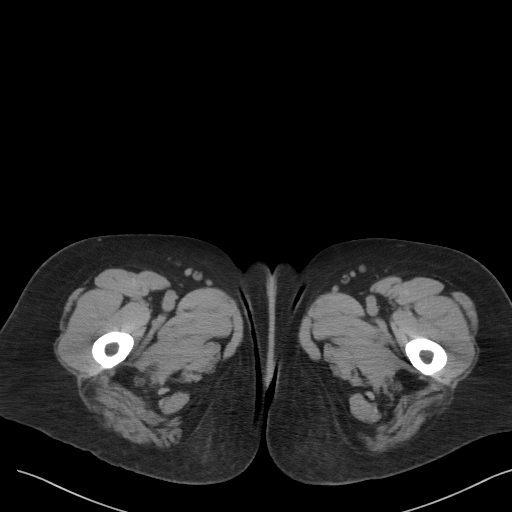
[im 4/87  bone]
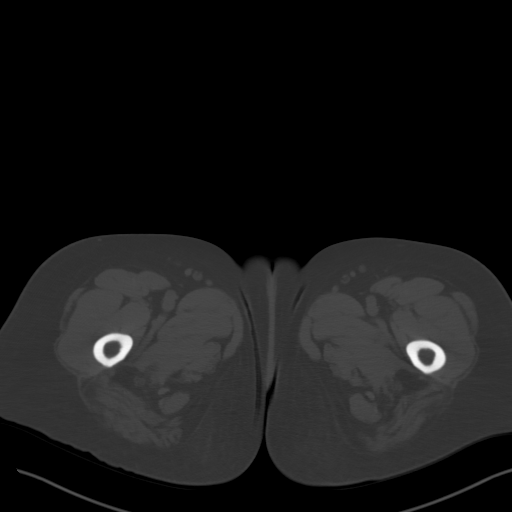
[im 10/87  soft-tissue]
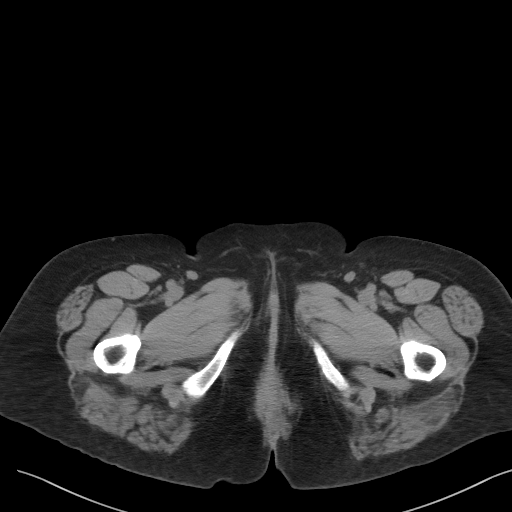
[im 16/87  soft-tissue]
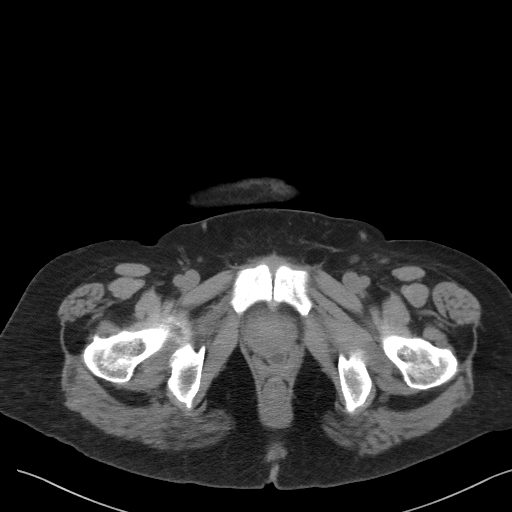
[im 23/87  soft-tissue]
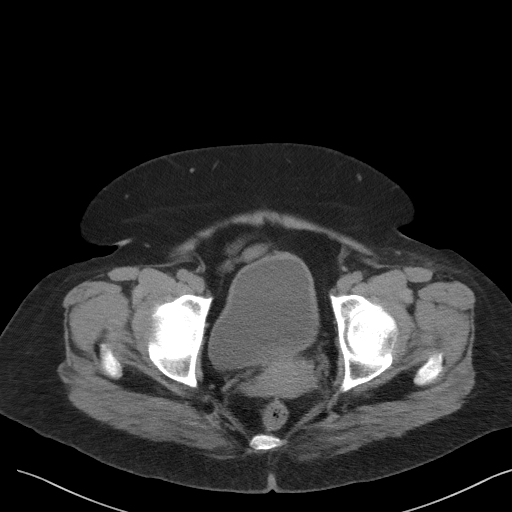
[im 29/87  soft-tissue]
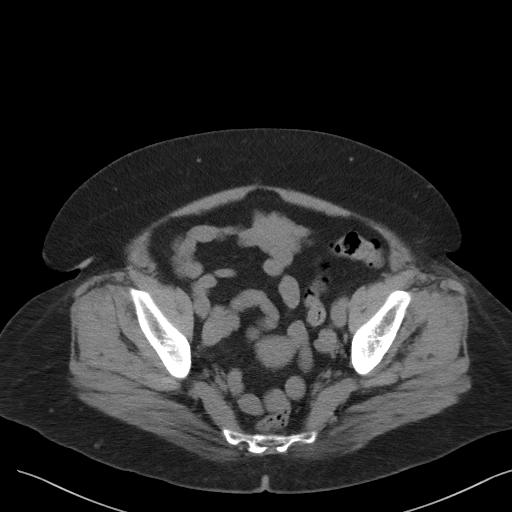
[im 36/87  soft-tissue]
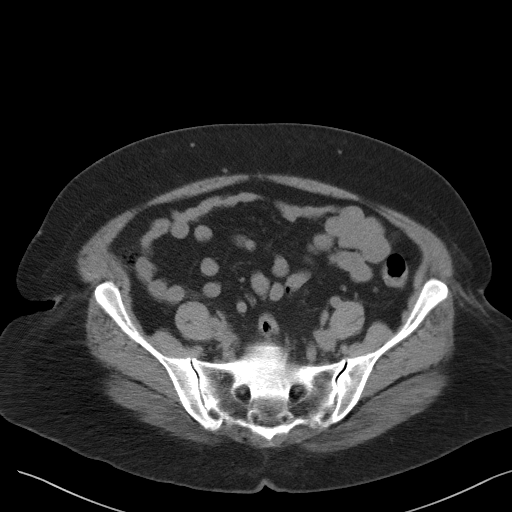
[im 42/87  soft-tissue]
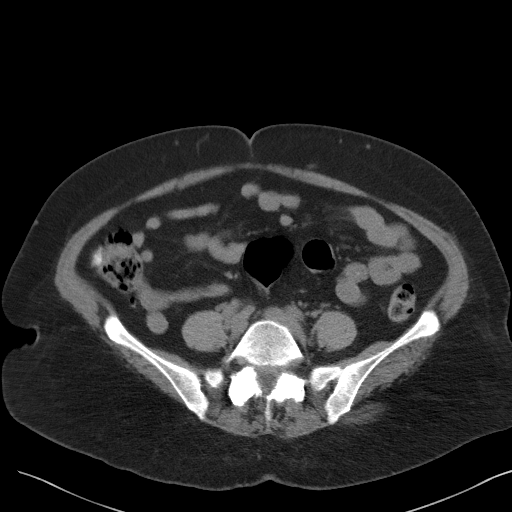
[im 45/87  soft-tissue]
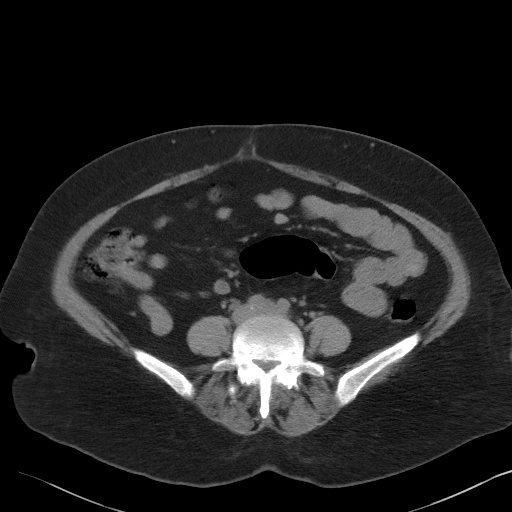
[im 51/87  soft-tissue]
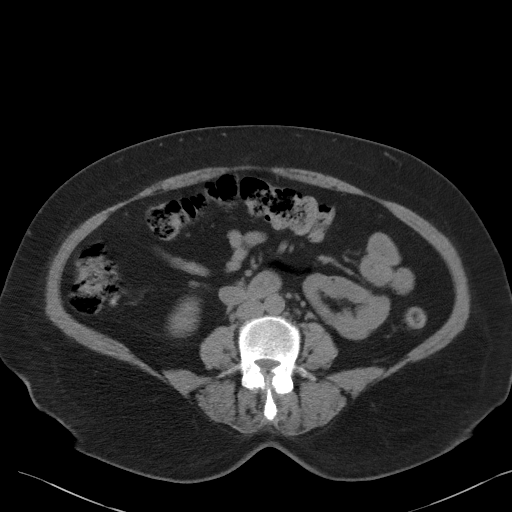
[im 51/87  bone]
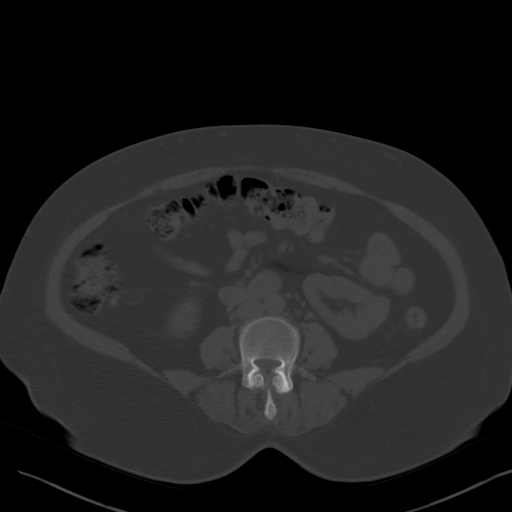
[im 58/87  soft-tissue]
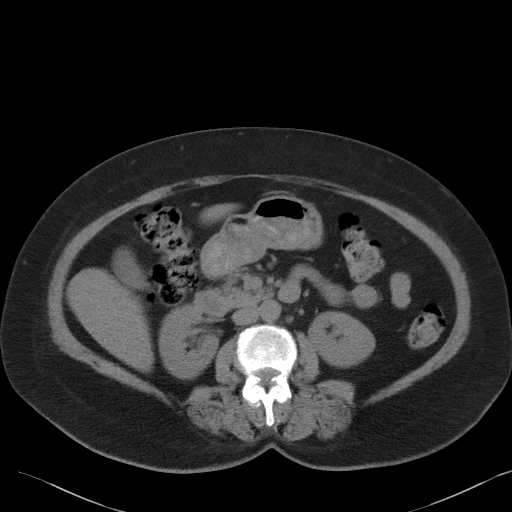
[im 64/87  soft-tissue]
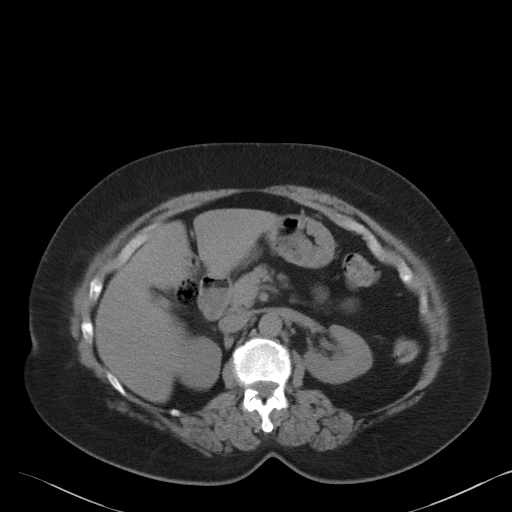
[im 71/87  soft-tissue]
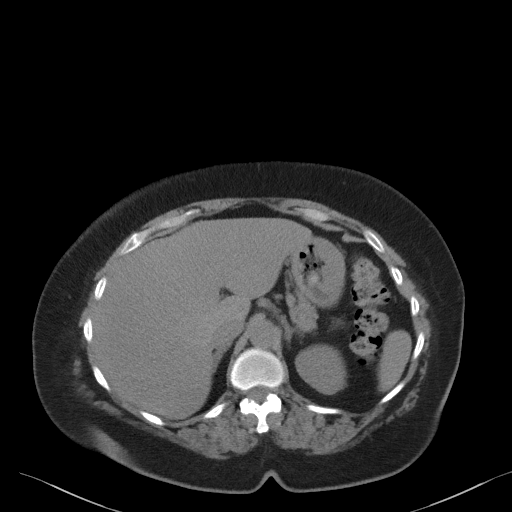
[im 77/87  soft-tissue]
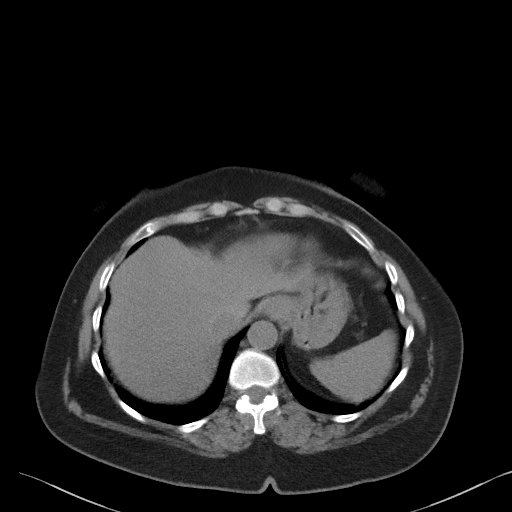
[im 83/87  soft-tissue]
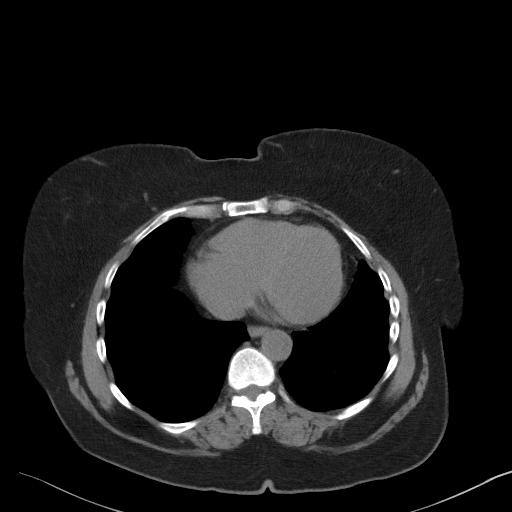

[Series 5: coronal st · coronal · 0.77mm/px · 3 of 95 slices shown]
[im 32/95  soft-tissue]
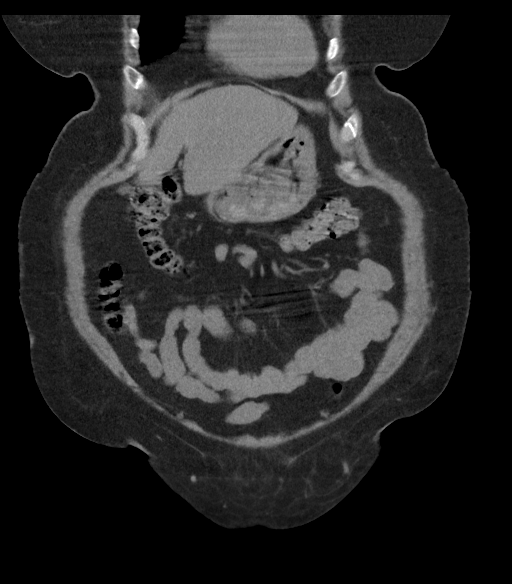
[im 42/95  soft-tissue]
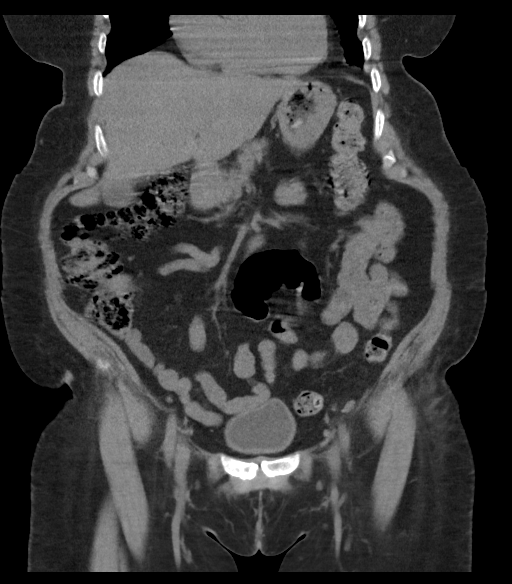
[im 53/95  soft-tissue]
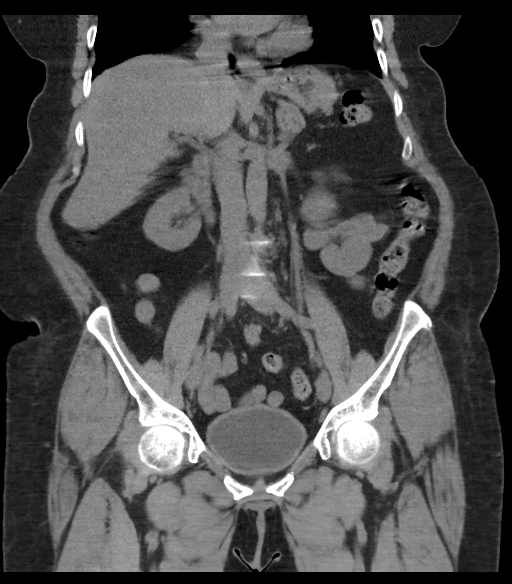

[17 of 46 positions shown; findings below may reference images not displayed]

FINDINGS: Lower chest: Lung bases demonstrate no acute consolidation or
effusion. Heart size within normal limits.

Hepatobiliary: No focal hepatic abnormality. No calcified
gallstones. No biliary dilatation.

Pancreas: Unremarkable. No pancreatic ductal dilatation or
surrounding inflammatory changes.

Spleen: Normal in size without focal abnormality.

Adrenals/Urinary Tract: Adrenal glands are unremarkable. Kidneys are
normal, without renal calculi, focal lesion, or hydronephrosis.
Bladder is unremarkable.

Stomach/Bowel: Stomach is within normal limits. Appendix appears
normal. No evidence of bowel wall thickening, distention, or
inflammatory changes.

Vascular/Lymphatic: Mild aortic atherosclerosis. No aneurysmal
dilatation. No significantly enlarged lymph nodes.

Reproductive: Uterus and bilateral adnexa are unremarkable.

Other: Negative for free air or free fluid.

Musculoskeletal: No acute or significant osseous findings.
IMPRESSION: 1. No CT evidence for nephrolithiasis, hydronephrosis or ureteral
stone
2. There are no acute abnormalities visualized.

## 2020-10-23 ENCOUNTER — Telehealth: Payer: Self-pay | Admitting: General Practice

## 2020-10-23 NOTE — Telephone Encounter (Signed)
Called patient to make an appointment , no answer left message to return our call.

## 2020-10-23 NOTE — Telephone Encounter (Signed)
Patient states she like re-establish care with you . Please Advise

## 2020-10-23 NOTE — Telephone Encounter (Signed)
OK 

## 2020-11-14 ENCOUNTER — Other Ambulatory Visit: Payer: Self-pay

## 2020-11-14 ENCOUNTER — Ambulatory Visit (INDEPENDENT_AMBULATORY_CARE_PROVIDER_SITE_OTHER): Payer: Self-pay | Admitting: Family

## 2020-11-14 VITALS — BP 122/75 | HR 69 | Temp 98.4°F | Resp 16 | Ht 62.0 in | Wt 154.0 lb

## 2020-11-14 DIAGNOSIS — E785 Hyperlipidemia, unspecified: Secondary | ICD-10-CM

## 2020-11-14 DIAGNOSIS — M8580 Other specified disorders of bone density and structure, unspecified site: Secondary | ICD-10-CM

## 2020-11-14 DIAGNOSIS — M545 Low back pain, unspecified: Secondary | ICD-10-CM

## 2020-11-14 DIAGNOSIS — E119 Type 2 diabetes mellitus without complications: Secondary | ICD-10-CM

## 2020-11-14 DIAGNOSIS — E1165 Type 2 diabetes mellitus with hyperglycemia: Secondary | ICD-10-CM

## 2020-11-14 DIAGNOSIS — Z23 Encounter for immunization: Secondary | ICD-10-CM

## 2020-11-14 DIAGNOSIS — E782 Mixed hyperlipidemia: Secondary | ICD-10-CM

## 2020-11-14 DIAGNOSIS — Z0000 Encounter for general adult medical examination without abnormal findings: Secondary | ICD-10-CM

## 2020-11-14 DIAGNOSIS — R0789 Other chest pain: Secondary | ICD-10-CM

## 2020-11-14 LAB — LIPID PANEL
Cholesterol: 242 mg/dL — ABNORMAL HIGH (ref 0–200)
HDL: 61.5 mg/dL (ref >39.00)
LDL Cholesterol: 152 mg/dL — ABNORMAL HIGH (ref 0–99)
NonHDL: 180.92
Total CHOL/HDL Ratio: 4
Triglycerides: 143 mg/dL (ref 0.0–149.0)
VLDL: 28.6 mg/dL (ref 0.0–40.0)

## 2020-11-14 LAB — COMPREHENSIVE METABOLIC PANEL
ALT: 18 U/L (ref 0–35)
AST: 18 U/L (ref 0–37)
Albumin: 3.9 g/dL (ref 3.5–5.2)
Alkaline Phosphatase: 65 U/L (ref 39–117)
BUN: 15 mg/dL (ref 6–23)
CO2: 27 mEq/L (ref 19–32)
Calcium: 9.3 mg/dL (ref 8.4–10.5)
Chloride: 102 mEq/L (ref 96–112)
Creatinine, Ser: 0.71 mg/dL (ref 0.40–1.20)
GFR: 88.53 mL/min (ref >60.00)
Glucose, Bld: 102 mg/dL — ABNORMAL HIGH (ref 70–99)
Potassium: 4.4 mEq/L (ref 3.5–5.1)
Sodium: 137 mEq/L (ref 135–145)
Total Bilirubin: 0.4 mg/dL (ref 0.2–1.2)
Total Protein: 7.5 g/dL (ref 6.0–8.3)

## 2020-11-14 LAB — MICROALBUMIN / CREATININE URINE RATIO
Creatinine,U: 133 mg/dL
Microalb Creat Ratio: 1.1 mg/g (ref 0.0–30.0)
Microalb, Ur: 1.5 mg/dL (ref 0.0–1.9)

## 2020-11-14 LAB — HEMOGLOBIN A1C: Hgb A1c MFr Bld: 6.8 % — ABNORMAL HIGH (ref 4.6–6.5)

## 2020-11-14 MED ORDER — ASPIRIN 81 MG PO TBEC: 81.0000 mg | DELAYED_RELEASE_TABLET | Freq: Every day | ORAL | 12 refills | Status: DC

## 2020-11-14 MED ORDER — ATORVASTATIN CALCIUM 20 MG PO TABS: 20.0000 mg | ORAL_TABLET | Freq: Every day | ORAL | 1 refills | Status: DC

## 2020-11-14 NOTE — Patient Instructions (Signed)
Please complete lab work prior to leaving. Please go to the ER if you develop recurrent chest pain.   You should be contacted about scheduling mammogram, bone density, colonoscopy and cardiology evaluation. You may use tylenol as needed for arthritis pain. Please restart atorvastatin for cholesterol and add aspirin 81mg  once daily to help prevent risk of heart attack and stroke.

## 2020-11-14 NOTE — Progress Notes (Signed)
Subjective:    Patient ID: Chloe Reid, female    DOB: 04/27/1956, 64 y.o.   MRN: [id number]  HPI  Patient is a 64 yr old female who presents today to re-establish care. She is accompanied by her daughter who assists in translation.   I last saw her in 2015.  Since I saw her she had been following at th [facility].  Sat down hard on the wooden part of her couch and hurt her tailbone.  Still having pain.  She reports ome chronic knee pain.    She also reports occasional left sided chest pain- it happens "randomly." Can occur at rest or with activity.    DM2-  Lab Results  Component Value Date   HGBA1C 6.4 06/18/2017   HGBA1C 7.2 (H) 11/03/2014   HGBA1C 6.6 (H) 07/07/2014   Lab Results  Component Value Date   MICROALBUR 10 06/18/2017   LDLCALC 138 (H) 06/18/2017   CREATININE 0.70 01/17/2018   Hyperlipidemia-not currently on statin.   Flu shot today  Left ear feels blocked.    Review of Systems  Constitutional: Negative for unexpected weight change.  HENT: Positive for hearing loss (left ear). Negative for rhinorrhea.   Eyes: Negative for visual disturbance.  Respiratory: Negative for cough and shortness of breath.   Cardiovascular: Positive for chest pain.  Gastrointestinal: Negative for constipation and diarrhea.  Genitourinary: Negative for dysuria and frequency.       Occasional urinary incontinence  Musculoskeletal: Positive for arthralgias (knees).  Skin: Negative for rash.  Neurological: Positive for headaches (rare migraines).  Hematological: Negative for adenopathy.  Psychiatric/Behavioral:       Occasional depression/anxiety related to family stress.  She lives with her daughter       Past Medical History:  Diagnosis Date  . Cataracts, bilateral   . Glaucoma   . Kidney stone   . Macular drusen   . Migraine      Social History   Socioeconomic History  . Marital status: Married    Spouse name: Not on file  . Number of  children: Not on file  . Years of education: Not on file  . Highest education level: Not on file  Occupational History  . Not on file  Tobacco Use  . Smoking status: Never Smoker  . Smokeless tobacco: Never Used  Substance and Sexual Activity  . Alcohol use: No  . Drug use: Not on file  . Sexual activity: Not on file  Other Topics Concern  . Not on file  Social History Narrative   Has 4 children- 3 daughter's 1 son, all live at home   Married   Cooks at [employer]   Enjoys television, cooking   No pets   Grew up in [place]- moved here in [place] Strain: Not on [employer] Insecurity: Not on file  Transportation Needs: Not on file  Physical Activity: Not on file  Stress: Not on file  Social Connections: Not on file  Intimate Partner Violence: Not on file    Past Surgical History:  Procedure Laterality Date  . uterine biopsy      Family History  Problem Relation Age of Onset  . Heart disease Father   . Kidney disease Father     No Known Allergies  Current Outpatient Medications on File Prior to Visit  Medication Sig Dispense Refill  . acetaminophen (TYLENOL) 500 MG tablet  Take 1 tablet (500 mg total) by mouth every 6 (six) hours as needed. 30 tablet 0  . calcium-vitamin D (OSCAL WITH D) 500-200 MG-UNIT tablet Take 1 tablet by mouth 3 (three) times daily. 90 tablet 12  . ibuprofen (ADVIL,MOTRIN) 600 MG tablet Take 1 tablet (600 mg total) by mouth every 8 (eight) hours as needed (Take with food.). 30 tablet 0  . Multiple Vitamin (MULTIVITAMIN) tablet Take 1 tablet by mouth daily.    . metFORMIN (GLUCOPHAGE) 500 MG tablet Take 1 tablet (500 mg total) by mouth daily with breakfast. (Patient not taking: Reported on 11/14/2020) 30 tablet 2   No current facility-administered medications on file prior to visit.    BP 122/75 (BP Location: Right Arm, Patient Position: Sitting, Cuff Size: Small)   Pulse 69   Temp 98.4 F  (36.9 C) (Oral)   Resp 16   Ht 5\' 2"  (1.575 m)   Wt 154 lb (69.9 kg)   LMP 12/02/2003   SpO2 99%   BMI 28.17 kg/m    Objective:   Physical Exam Physical Exam  Constitutional: She is oriented to person, place, and time. She appears well-developed and well-nourished. No distress.  HENT:  Head: Normocephalic and atraumatic.  Right Ear: Tympanic membrane and ear canal normal.  Left Ear: impacted cerumen Mouth/Throat: Oropharynx is clear and moist.  Eyes: Pupils are equal, round, and reactive to light. No scleral icterus.  Neck: Normal range of motion. No thyromegaly present.  Cardiovascular: Normal rate and regular rhythm.   No murmur heard. Pulmonary/Chest: Effort normal and breath sounds normal. No respiratory distress. He has no wheezes. She has no rales. She exhibits no tenderness.  Abdominal: Soft. Bowel sounds are normal. She exhibits no distension and no mass. There is no tenderness. There is no rebound and no guarding.  Musculoskeletal: She exhibits no edema.  Lymphadenopathy:    She has no cervical adenopathy.  Neurological: She is alert and oriented to person, place, and time. She has normal patellar reflexes. She exhibits normal muscle tone. Coordination normal.  Skin: Skin is warm and dry.  Psychiatric: She has a normal mood and affect. Her behavior is normal. Judgment and thought content normal.  Breast/pelvic: deferred     Assessment & Plan:   Hyperlipidemia- resume atorvastatin 20mg  once daily. Check lipid panel.   DM2- control unknown.  Obtain a1c, urine microalbumin.   Atypical chest pain- add aspirin 81mg . EKG tracing is personally reviewed/interpreted.  EKG notes NSR.  No acute changes. Refer to cardiology for further evaluation. Pt is advised to go to the ER if recurrent chest pain.   Sacral pain- obtain x-ray to rule out fracture.  Hyperlipidemia- recommended that she restart lipitor 20mg  once daily to decrease CV risk. Obtain follow up lipid panel.     Flu shot today.    Refer for colo, mammogram, dexa  45 minutes spent on today's visit.   This visit occurred during the SARS-CoV-2 public health emergency.  Safety protocols were in place, including screening questions prior to the visit, additional usage of staff PPE, and extensive cleaning of exam room while observing appropriate contact time as indicated for disinfecting solutions.          Assessment & Plan:

## 2020-12-18 ENCOUNTER — Other Ambulatory Visit: Payer: Self-pay

## 2020-12-18 DIAGNOSIS — H409 Unspecified glaucoma: Secondary | ICD-10-CM | POA: Insufficient documentation

## 2020-12-18 DIAGNOSIS — N200 Calculus of kidney: Secondary | ICD-10-CM | POA: Insufficient documentation

## 2020-12-18 DIAGNOSIS — H35369 Drusen (degenerative) of macula, unspecified eye: Secondary | ICD-10-CM | POA: Insufficient documentation

## 2020-12-18 DIAGNOSIS — H269 Unspecified cataract: Secondary | ICD-10-CM | POA: Insufficient documentation

## 2020-12-20 ENCOUNTER — Ambulatory Visit: Payer: Self-pay | Admitting: Cardiology

## 2020-12-27 ENCOUNTER — Encounter: Payer: Self-pay | Admitting: Gastroenterology

## 2021-01-17 ENCOUNTER — Ambulatory Visit: Payer: Self-pay | Admitting: Cardiology

## 2021-02-11 ENCOUNTER — Telehealth: Payer: Self-pay

## 2021-02-11 NOTE — Telephone Encounter (Signed)
Left message for patient to call back to the office concerning appt; patient failed to show up for her PV appt;

## 2021-02-11 NOTE — Telephone Encounter (Signed)
Patient has failed to call back to the office to reschedule PV appt; patient was a no show for the PV appt that was scheduled for today;  No show letter sent to patient and PV and procedure appt cancelled;

## 2021-02-13 ENCOUNTER — Encounter: Payer: Medicaid Other | Admitting: Family

## 2021-02-22 ENCOUNTER — Encounter: Payer: Medicaid Other | Admitting: Gastroenterology

## 2021-04-26 ENCOUNTER — Other Ambulatory Visit: Payer: Medicaid Other

## 2021-04-26 ENCOUNTER — Ambulatory Visit: Payer: Medicaid Other

## 2021-10-22 ENCOUNTER — Ambulatory Visit: Payer: Medicaid Other

## 2021-10-22 ENCOUNTER — Inpatient Hospital Stay: Admission: RE | Admit: 2021-10-22 | Payer: Medicaid Other | Source: Ambulatory Visit

## 2022-04-11 ENCOUNTER — Encounter: Payer: Self-pay | Admitting: Family

## 2022-04-11 NOTE — Progress Notes (Signed)
Sent to patient.

## 2022-05-12 ENCOUNTER — Ambulatory Visit (INDEPENDENT_AMBULATORY_CARE_PROVIDER_SITE_OTHER): Payer: Medicare Other | Admitting: Family

## 2022-05-12 VITALS — BP 118/67 | HR 67 | Temp 97.9°F | Resp 16 | Ht 63.0 in | Wt 151.0 lb

## 2022-05-12 DIAGNOSIS — R413 Other amnesia: Secondary | ICD-10-CM

## 2022-05-12 DIAGNOSIS — R5383 Other fatigue: Secondary | ICD-10-CM

## 2022-05-12 DIAGNOSIS — E1165 Type 2 diabetes mellitus with hyperglycemia: Secondary | ICD-10-CM

## 2022-05-12 DIAGNOSIS — Z23 Encounter for immunization: Secondary | ICD-10-CM

## 2022-05-12 DIAGNOSIS — H6122 Impacted cerumen, left ear: Secondary | ICD-10-CM | POA: Diagnosis not present

## 2022-05-12 DIAGNOSIS — Z1159 Encounter for screening for other viral diseases: Secondary | ICD-10-CM

## 2022-05-12 DIAGNOSIS — Z1231 Encounter for screening mammogram for malignant neoplasm of breast: Secondary | ICD-10-CM

## 2022-05-12 DIAGNOSIS — Z0000 Encounter for general adult medical examination without abnormal findings: Secondary | ICD-10-CM | POA: Diagnosis not present

## 2022-05-12 DIAGNOSIS — Z1211 Encounter for screening for malignant neoplasm of colon: Secondary | ICD-10-CM

## 2022-05-12 LAB — COMPREHENSIVE METABOLIC PANEL
ALT: 14 U/L (ref 0–35)
AST: 17 U/L (ref 0–37)
Albumin: 3.9 g/dL (ref 3.5–5.2)
Alkaline Phosphatase: 69 U/L (ref 39–117)
BUN: 13 mg/dL (ref 6–23)
CO2: 26 mEq/L (ref 19–32)
Calcium: 9.6 mg/dL (ref 8.4–10.5)
Chloride: 102 mEq/L (ref 96–112)
Creatinine, Ser: 0.71 mg/dL (ref 0.40–1.20)
GFR: 87.61 mL/min (ref >60.00)
Glucose, Bld: 95 mg/dL (ref 70–99)
Potassium: 4.5 mEq/L (ref 3.5–5.1)
Sodium: 137 mEq/L (ref 135–145)
Total Bilirubin: 0.6 mg/dL (ref 0.2–1.2)
Total Protein: 7.4 g/dL (ref 6.0–8.3)

## 2022-05-12 LAB — CBC WITH DIFFERENTIAL/PLATELET
Basophils Absolute: 0.1 10*3/uL (ref 0.0–0.1)
Basophils Relative: 0.7 % (ref 0.0–3.0)
Eosinophils Absolute: 0.2 10*3/uL (ref 0.0–0.7)
Eosinophils Relative: 1.8 % (ref 0.0–5.0)
HCT: 39.3 % (ref 36.0–46.0)
Hemoglobin: 12.7 g/dL (ref 12.0–15.0)
Lymphocytes Relative: 32.1 % (ref 12.0–46.0)
Lymphs Abs: 2.6 10*3/uL (ref 0.7–4.0)
MCHC: 32.3 g/dL (ref 30.0–36.0)
MCV: 76.8 fl — ABNORMAL LOW (ref 78.0–100.0)
Monocytes Absolute: 0.5 10*3/uL (ref 0.1–1.0)
Monocytes Relative: 5.5 % (ref 3.0–12.0)
Neutro Abs: 4.9 10*3/uL (ref 1.4–7.7)
Neutrophils Relative %: 59.9 % (ref 43.0–77.0)
Platelets: 244 10*3/uL (ref 150.0–400.0)
RBC: 5.11 Mil/uL (ref 3.87–5.11)
RDW: 14.5 % (ref 11.5–15.5)
WBC: 8.2 10*3/uL (ref 4.0–10.5)

## 2022-05-12 LAB — HEMOGLOBIN A1C: Hgb A1c MFr Bld: 6.8 % — ABNORMAL HIGH (ref 4.6–6.5)

## 2022-05-12 LAB — MICROALBUMIN / CREATININE URINE RATIO
Creatinine,U: 126.5 mg/dL
Microalb Creat Ratio: 1.6 mg/g (ref 0.0–30.0)
Microalb, Ur: 2.1 mg/dL — ABNORMAL HIGH (ref 0.0–1.9)

## 2022-05-12 LAB — TSH: TSH: 1.86 u[IU]/mL (ref 0.35–5.50)

## 2022-05-12 MED ORDER — TETANUS-DIPHTHERIA TOXOIDS TD 2-2 LF/0.5ML IM SUSP: 0.5000 mL | Freq: Once | INTRAMUSCULAR | 0 refills | Status: AC

## 2022-05-12 MED ORDER — SHINGRIX 50 MCG/0.5ML IM SUSR: INTRAMUSCULAR | 1 refills | Status: DC

## 2022-05-12 NOTE — Progress Notes (Signed)
Subjective:    Chloe Reid is a 67 y.o. female who presents for Medicare Annual/Subsequent preventive examination. She is accompanied by her daughter. She needs new glasses as hers broke.  Preventive Screening-Counseling & Management  Tobacco Social History   Tobacco Use  Smoking Status Never  Smokeless Tobacco Never     Problems Prior to Visit 1.  Diabetes-  Lab Results  Component Value Date   HGBA1C 6.8 (H) 11/14/2020   HGBA1C 6.4 06/18/2017   HGBA1C 7.2 (H) 11/03/2014   Lab Results  Component Value Date   MICROALBUR 1.5 11/14/2020   LDLCALC 152 (H) 11/14/2020   CREATININE 0.71 11/14/2020   She notes increased generalized weakness. Also has some pain in her knees. Notes that she gets dizzy if she walks too much.    Has had 2 covid shots.    Current Problems (verified) Patient Active Problem List   Diagnosis Date Noted   Macular drusen    Kidney stone    Glaucoma    Cataracts, bilateral    Acute pain of left knee 06/05/2017   Onychomycosis 07/09/2014   Chest pain 03/24/2014   Urinary incontinence 03/24/2014   Uncontrolled diabetes mellitus type 2 without complications 51/88/4166   Hyperlipidemia 03/13/2014   Routine general medical examination at a health care facility 03/10/2014   Migraine 03/10/2014    Medications Prior to Visit Current Outpatient Medications on File Prior to Visit  Medication Sig Dispense Refill   acetaminophen (TYLENOL) 500 MG tablet Take 1 tablet (500 mg total) by mouth every 6 (six) hours as needed. 30 tablet 0   calcium-vitamin D (OSCAL WITH D) 500-200 MG-UNIT tablet Take 1 tablet by mouth 3 (three) times daily. 90 tablet 12   Multiple Vitamin (MULTIVITAMIN) tablet Take 1 tablet by mouth daily.     No current facility-administered medications on file prior to visit.    Current Medications (verified) Current Outpatient Medications  Medication Sig Dispense Refill   acetaminophen (TYLENOL) 500 MG tablet Take 1 tablet (500 mg total) by  mouth every 6 (six) hours as needed. 30 tablet 0   calcium-vitamin D (OSCAL WITH D) 500-200 MG-UNIT tablet Take 1 tablet by mouth 3 (three) times daily. 90 tablet 12   diptheria-tetanus toxoids (DECAVAC) 2-2 LF/0.5ML injection Inject 0.5 mLs into the muscle once for 1 dose. 0.5 mL 0   Multiple Vitamin (MULTIVITAMIN) tablet Take 1 tablet by mouth daily.     Zoster Vaccine Adjuvanted [facility]) injection Inject 0.5 mcg IM now and again in 2-6 months. 0.5 mL 1   No current facility-administered medications for this visit.     Allergies (verified) Patient has no known allergies.   PAST HISTORY  Family History Family History  Problem Relation Age of Onset   Heart disease Father    Kidney disease Father     Social History Social History   Tobacco Use   Smoking status: Never   Smokeless tobacco: Never  Substance Use Topics   Alcohol use: No     Are there smokers in your home (other than you)? No  Risk Factors Current exercise habits: gets easily tired walking Dietary issues discussed: diet is fair    Cardiac risk factors: diabetes mellitus and sedentary lifestyle.  Depression Screen (Note: if answer to either of the following is "Yes", a more complete depression screening is indicated)   Over the past two weeks, have you felt down, depressed or hopeless? No  Over the past two weeks, have you felt little interest  or pleasure in doing things? No  Have you lost interest or pleasure in daily life? No  Do you often feel hopeless? No  Do you cry easily over simple problems? No  Activities of Daily Living In your present state of health, do you have any difficulty performing the following activities?:  Driving? Yes- no longer driving Managing money?  No Feeding yourself? No Getting from bed to chair? NoNo exam performed today, Climbing a flight of stairs? No Preparing food and eating?: No Bathing or showering? No Getting dressed: No Getting to the toilet? No Using the  toilet:No Moving around from place to place: No In the past year have you fallen or had a near fall?: once in the kitchen  No Finds her self more forgetful  Hearing Difficulties: Yes Do you often ask people to speak up or repeat themselves? No (has trouble hearing out of left ear only). Do you experience ringing or noises in your ears? occasionally Do you have difficulty understanding soft or whispered voices? No   Do you feel that you have a problem with memory? Yes  Do you often misplace items? Yes  Do you feel safe at home?  Yes  Cognitive Testing  Alert? Yes  Normal Appearance?Yes  Oriented to person? Yes  Place? Yes   Time? Yes  Recall of three objects?  Yes  Can perform simple calculations? No recently having trouble  Displays appropriate judgment?Yes  Can read the correct time from a watch face?Yes   Advanced Directives have been discussed with the patient? Yes  List the Names of Other Physician/Practitioners you currently use: 1.  none  Indicate any recent Medical Services you may have received from other than [facility] providers in the past year (date may be approximate).  Immunization History  Administered Date(s) Administered   Fluad Quad(high Dose 65+) 11/14/2020   Influenza,inj,Quad PF,6+ Mos 08/25/2014   Moderna SARS-COV2 Booster Vaccination 06/10/2020, 07/09/2020   PNEUMOCOCCAL CONJUGATE-20 05/12/2022   Pneumococcal-Unspecified 12/01/2009   Tdap 12/01/2006    Screening Tests Health Maintenance  Topic Date Due   Hepatitis C Screening  Never done   COLONOSCOPY (Pts 45-76yr Insurance coverage will need to be confirmed)  Never done   Zoster Vaccines- Shingrix (1 of 2) Never done   OPHTHALMOLOGY EXAM  09/09/2015   MAMMOGRAM  03/13/2016   TETANUS/TDAP  12/01/2016   COVID-19 Vaccine (1) 07/09/2020   HEMOGLOBIN A1C  05/15/2021   FOOT EXAM  11/14/2021   URINE MICROALBUMIN  11/14/2021   INFLUENZA VACCINE  07/01/2022   Pneumonia Vaccine 68 Years old  Completed    DEXA SCAN  Completed   HPV VACCINES  Aged Out    All answers were reviewed with the patient and necessary referrals were made:  MNance Pear NP   05/12/2022   History reviewed: allergies, current medications, past family history, past medical history, past social history, past surgical history, and problem list  Review of Systems Pertinent items are noted in HPI.    Objective:      Body mass index is 26.75 kg/m. BP 118/67 (BP Location: Left Arm, Patient Position: Sitting, Cuff Size: Small)   Pulse 67   Temp 97.9 F (36.6 C) (Oral)   Resp 16   Ht '5\' 3"'$  (1.6 m)   Wt 151 lb (68.5 kg)   LMP 12/02/2003   SpO2 99%   BMI 26.75 kg/m     Physical Exam  Constitutional: She is oriented to person, place, and time. She appears  well-developed and well-nourished. No distress.  HENT:  Head: Normocephalic and atraumatic.  Right Ear: Tympanic membrane and ear canal normal.  Left Ear: Tympanic membrane and ear canal normal.  Mouth/Throat: Oropharynx is clear and moist.  Eyes: Pupils are equal, round, and reactive to light. No scleral icterus.  Neck: Normal range of motion. No thyromegaly present.  Cardiovascular: Normal rate and regular rhythm.   No murmur heard. Pulmonary/Chest: Effort normal and breath sounds normal. No respiratory distress. He has no wheezes. She has no rales. She exhibits no tenderness.  Abdominal: Soft. Bowel sounds are normal. She exhibits no distension and no mass. There is no tenderness. There is no rebound and no guarding.  Musculoskeletal: She exhibits no edema.  Lymphadenopathy:    She has no cervical adenopathy.  Neurological: She is alert and oriented to person, place, and time. She has normal patellar reflexes. She exhibits normal muscle tone. Coordination normal.  Skin: Skin is warm and dry.  Psychiatric: She has a normal mood and affect. Her behavior is normal. Judgment and thought content normal.  Breast/pelvic: deferred            Assessment & Plan:      Assessment:      Plan:     During the course of the visit the patient was educated and counseled about appropriate screening and preventive services including:   Pneumococcal vaccine  Td vaccine Screening mammography Bone densitometry screening Colorectal cancer screening Pt given paperwork on advanced directives  Diet review for nutrition referral? Yes ____  Not Indicated __x__   Patient Instructions (the written plan) was given to the patient.  Medicare Attestation I have personally reviewed: The patient's medical and social history Their use of alcohol, tobacco or illicit drugs Their current medications and supplements The patient's functional ability including ADLs,fall risks, home safety risks, cognitive, and hearing and visual impairment Diet and physical activities Evidence for depression or mood disorders  The patient's weight, height, BMI, and visual acuity have been recorded in the chart.  I have made referrals, counseling, and provided education to the patient based on review of the above and I have provided the patient with a written personalized care plan for preventive services.     Nance Pear, NP   05/12/2022

## 2022-05-13 LAB — HEPATITIS C ANTIBODY
Hepatitis C Ab: NONREACTIVE
SIGNAL TO CUT-OFF: 0.1 (ref <1.00)

## 2022-05-14 ENCOUNTER — Telehealth: Payer: Self-pay | Admitting: Family

## 2022-05-14 DIAGNOSIS — E1129 Type 2 diabetes mellitus with other diabetic kidney complication: Secondary | ICD-10-CM | POA: Insufficient documentation

## 2022-05-14 MED ORDER — LISINOPRIL 2.5 MG PO TABS: 2.5000 mg | ORAL_TABLET | Freq: Every day | ORAL | 0 refills | Status: DC

## 2022-05-14 NOTE — Telephone Encounter (Signed)
Called  but no answer, lvm for daughter to call back

## 2022-05-14 NOTE — Telephone Encounter (Signed)
Please advise daughter that pt has some protein in her urine. To help protect her kidneys, I would recommend that she add Lisinopril 2.'5mg'$  once daily.  Rx has been sent to [facility] [address]. Repeat bmet in 1-2 weeks, dx microalbuminuria.   Thyroid, blood count and sugar look stable.

## 2022-05-14 NOTE — Addendum Note (Signed)
Addended by: Debbrah Alar on: 05/14/2022 11:23 AM   Modules accepted: Orders

## 2022-05-15 ENCOUNTER — Other Ambulatory Visit: Payer: Self-pay

## 2022-05-15 DIAGNOSIS — R809 Proteinuria, unspecified: Secondary | ICD-10-CM

## 2022-05-15 NOTE — Telephone Encounter (Signed)
Called again but no answer, lvm for patient to call back about results.

## 2022-05-15 NOTE — Telephone Encounter (Signed)
Patient's daughter advised of results, new medication and scheduled patient to come in 05-26-22 for repeat bmet

## 2022-05-26 ENCOUNTER — Other Ambulatory Visit: Payer: Medicare Other

## 2022-06-09 ENCOUNTER — Ambulatory Visit (HOSPITAL_BASED_OUTPATIENT_CLINIC_OR_DEPARTMENT_OTHER): Payer: Medicare Other

## 2022-06-10 ENCOUNTER — Ambulatory Visit (HOSPITAL_BASED_OUTPATIENT_CLINIC_OR_DEPARTMENT_OTHER)
Admission: RE | Admit: 2022-06-10 | Discharge: 2022-06-10 | Disposition: A | Discharge status: Home / Self Care | Payer: Medicare Other | Source: Ambulatory Visit | Attending: Family | Admitting: Family

## 2022-06-10 ENCOUNTER — Encounter (HOSPITAL_BASED_OUTPATIENT_CLINIC_OR_DEPARTMENT_OTHER): Payer: Self-pay

## 2022-06-10 DIAGNOSIS — Z1231 Encounter for screening mammogram for malignant neoplasm of breast: Secondary | ICD-10-CM | POA: Insufficient documentation

## 2022-06-12 ENCOUNTER — Emergency Department (HOSPITAL_BASED_OUTPATIENT_CLINIC_OR_DEPARTMENT_OTHER)
Admission: EM | Admit: 2022-06-12 | Discharge: 2022-06-12 | Disposition: A | Discharge status: Home / Self Care | Payer: Medicare Other | Attending: Emergency Medicine | Admitting: Emergency Medicine

## 2022-06-12 ENCOUNTER — Other Ambulatory Visit: Payer: Self-pay

## 2022-06-12 ENCOUNTER — Emergency Department (HOSPITAL_BASED_OUTPATIENT_CLINIC_OR_DEPARTMENT_OTHER): Payer: Medicare Other

## 2022-06-12 ENCOUNTER — Encounter (HOSPITAL_BASED_OUTPATIENT_CLINIC_OR_DEPARTMENT_OTHER): Payer: Self-pay | Admitting: Emergency Medicine

## 2022-06-12 ENCOUNTER — Other Ambulatory Visit: Payer: Self-pay | Admitting: Family

## 2022-06-12 DIAGNOSIS — M79601 Pain in right arm: Secondary | ICD-10-CM | POA: Diagnosis present

## 2022-06-12 DIAGNOSIS — M5412 Radiculopathy, cervical region: Secondary | ICD-10-CM | POA: Diagnosis not present

## 2022-06-12 DIAGNOSIS — R928 Other abnormal and inconclusive findings on diagnostic imaging of breast: Secondary | ICD-10-CM

## 2022-06-12 LAB — CBC WITH DIFFERENTIAL/PLATELET
Abs Immature Granulocytes: 0.03 10*3/uL (ref 0.00–0.07)
Basophils Absolute: 0 10*3/uL (ref 0.0–0.1)
Basophils Relative: 0 %
Eosinophils Absolute: 0.2 10*3/uL (ref 0.0–0.5)
Eosinophils Relative: 2 %
HCT: 40.3 % (ref 36.0–46.0)
Hemoglobin: 12.7 g/dL (ref 12.0–15.0)
Immature Granulocytes: 0 %
Lymphocytes Relative: 32 %
Lymphs Abs: 3.1 10*3/uL (ref 0.7–4.0)
MCH: 24.8 pg — ABNORMAL LOW (ref 26.0–34.0)
MCHC: 31.5 g/dL (ref 30.0–36.0)
MCV: 78.6 fL — ABNORMAL LOW (ref 80.0–100.0)
Monocytes Absolute: 0.6 10*3/uL (ref 0.1–1.0)
Monocytes Relative: 6 %
Neutro Abs: 5.7 10*3/uL (ref 1.7–7.7)
Neutrophils Relative %: 60 %
Platelets: 272 10*3/uL (ref 150–400)
RBC: 5.13 MIL/uL — ABNORMAL HIGH (ref 3.87–5.11)
RDW: 14.6 % (ref 11.5–15.5)
WBC: 9.6 10*3/uL (ref 4.0–10.5)
nRBC: 0 % (ref 0.0–0.2)

## 2022-06-12 LAB — COMPREHENSIVE METABOLIC PANEL
ALT: 17 U/L (ref 0–44)
AST: 19 U/L (ref 15–41)
Albumin: 3.7 g/dL (ref 3.5–5.0)
Alkaline Phosphatase: 60 U/L (ref 38–126)
Anion gap: 8 (ref 5–15)
BUN: 14 mg/dL (ref 8–23)
CO2: 24 mmol/L (ref 22–32)
Calcium: 9.2 mg/dL (ref 8.9–10.3)
Chloride: 106 mmol/L (ref 98–111)
Creatinine, Ser: 0.7 mg/dL (ref 0.44–1.00)
GFR, Estimated: >60 mL/min (ref >60)
Glucose, Bld: 100 mg/dL — ABNORMAL HIGH (ref 70–99)
Potassium: 4.3 mmol/L (ref 3.5–5.1)
Sodium: 138 mmol/L (ref 135–145)
Total Bilirubin: 0.7 mg/dL (ref 0.3–1.2)
Total Protein: 7.7 g/dL (ref 6.5–8.1)

## 2022-06-12 LAB — MAGNESIUM: Magnesium: 2 mg/dL (ref 1.7–2.4)

## 2022-06-12 MED ORDER — FENTANYL CITRATE PF 50 MCG/ML IJ SOSY
50.0000 ug | PREFILLED_SYRINGE | Freq: Once | INTRAMUSCULAR | Status: AC
  Administered 2022-06-12: 50 ug via INTRAVENOUS
  Filled 2022-06-12: qty 1

## 2022-06-12 MED ORDER — METHYLPREDNISOLONE 4 MG PO TBPK: ORAL_TABLET | ORAL | 0 refills | Status: DC

## 2022-06-12 MED ORDER — TIZANIDINE HCL 4 MG PO TABS: 4.0000 mg | ORAL_TABLET | Freq: Three times a day (TID) | ORAL | 0 refills | Status: AC | PRN

## 2022-06-12 NOTE — Discharge Instructions (Addendum)
We saw you for your right neck, shoulder, and arm pain.  We did not see any any abnormalities on your CT scan.  Neurology would like to further evaluate you in the outpatient setting.  Take tizanidine 4 mg every 8-12 hours as needed for muscle relaxation.  Take Medrol Dosepak as prescribed.  Follow-up with neurology outpatient as provided and continue to stay hydrated and stretch as tolerated.  Follow-up with your primary care physician within the next 1 to 2 weeks.  Return to the emergency department if you experience new or worsening pain or weakness.

## 2022-06-12 NOTE — ED Triage Notes (Signed)
Patient presents to ED via POV from home with daughter. Here with right shoulder pain that radiates down her arm. Pain has been present x 3 weeks. Seen at urgent care for same. Denies injury or trauma.

## 2022-06-12 NOTE — ED Provider Notes (Signed)
[facility] EMERGENCY DEPARTMENT Provider Note   CSN: [id number] Arrival date & time: 06/12/22  1008     History  Chief Complaint  Patient presents with   Arm Pain    Chloe Reid is a 66 y.o. female.  Chloe Reid 66 year old female with history of migraines who presents with 2 weeks of right neck and shoulder blade pain that radiates down her right arm associated with numbness and weakness in the right arm.  Daughter interprets for patient.  She states 2 weeks ago she started to feel pain in her back and neck has steadily gotten worse and moved down her right arm to the point where she is unable to hold her grandchild with her right arm.  She denies any injury or inciting event.  She has not had a previous back/neck/head injury.  Nothing really alleviates her pain but movement and arm use make the pain worse.  Pain is constant and waxing and waning.  She also notes knots in her muscles of her right arm that are the most tender spots.  She denies any chest pain, shortness of breath, dizziness, syncope, abdominal pain, nausea, or vomiting.  The history is provided by the patient and a relative. A language interpreter was used [employer] [phone]) Until patient and family confirmed and prefered daughter to translate).  Arm Pain Pertinent negatives include no chest pain, no abdominal pain, no headaches and no shortness of breath.       Home Medications Prior to Admission medications   Medication Sig Start Date End Date Taking? Authorizing Provider  methylPREDNISolone (MEDROL DOSEPAK) 4 MG TBPK tablet Follow directions on package 06/12/22  Yes Johny Blamer, DO  tiZANidine (ZANAFLEX) 4 MG tablet Take 1 tablet (4 mg total) by mouth every 8 (eight) hours as needed for up to 14 days for muscle spasms. 06/12/22 06/26/22 Yes Johny Blamer, DO  acetaminophen (TYLENOL) 500 MG tablet Take 1 tablet (500 mg total) by mouth every 6 (six) hours as needed. 05/30/17   Shary Decamp,  PA-C  calcium-vitamin D (OSCAL WITH D) 500-200 MG-UNIT tablet Take 1 tablet by mouth 3 (three) times daily. 06/15/17   Leandrew Koyanagi, MD  lisinopril (ZESTRIL) 2.5 MG tablet Take 1 tablet (2.5 mg total) by mouth daily. 05/14/22   Debbrah Alar, NP  Multiple Vitamin (MULTIVITAMIN) tablet Take 1 tablet by mouth daily.    [provider]  Zoster Vaccine Adjuvanted [facility]) injection Inject 0.5 mcg IM now and again in 2-6 months. 05/12/22   Debbrah Alar, NP      Allergies    Patient has no known allergies.    Review of Systems   Review of Systems  Constitutional:  Negative for chills and fever.  Respiratory:  Negative for cough and shortness of breath.   Cardiovascular:  Negative for chest pain.  Gastrointestinal:  Negative for abdominal pain, constipation, nausea and vomiting.  Endocrine: Negative for cold intolerance.  Genitourinary:  Negative for dysuria and hematuria.  Musculoskeletal:  Positive for myalgias and neck pain. Negative for gait problem.  Neurological:  Negative for syncope, light-headedness and headaches.  Psychiatric/Behavioral:  Negative for confusion.     Physical Exam Updated Vital Signs BP 139/83   Pulse (!) 58   Temp 97.6 F (36.4 C) (Oral)   Resp 17   LMP 12/02/2003   SpO2 100%  Physical Exam Vitals reviewed.  Constitutional:      General: She is not in acute distress. HENT:  Head: Normocephalic.  Eyes:     Extraocular Movements: Extraocular movements intact.     Pupils: Pupils are equal, round, and reactive to light.  Cardiovascular:     Rate and Rhythm: Normal rate and regular rhythm.  Pulmonary:     Effort: Pulmonary effort is normal.     Breath sounds: Normal breath sounds.  Musculoskeletal:     Cervical back: Normal range of motion. Tenderness (Tenderness to paraspinal musculature and shoulder musculature on the right.) present.  Neurological:     Mental Status: She is alert.     Cranial Nerves: Cranial nerves 2-12 are  intact.     Sensory: Sensory deficit (Decree sensation over right upper extremity.) present.     Motor: Weakness (3-4/5 strength in right upper extremity.) present.     ED Results / Procedures / Treatments   Labs (all labs ordered are listed, but only abnormal results are displayed) Labs Reviewed  COMPREHENSIVE METABOLIC PANEL - Abnormal; Notable for the following components:      Result Value   Glucose, Bld 100 (*)    All other components within normal limits  CBC WITH DIFFERENTIAL/PLATELET - Abnormal; Notable for the following components:   RBC 5.13 (*)    MCV 78.6 (*)    MCH 24.8 (*)    All other components within normal limits  MAGNESIUM    EKG None  Radiology CT Cervical Spine Wo Contrast  Result Date: 06/12/2022 CLINICAL DATA:  Neck pain, spondyloarthropathy EXAM: CT CERVICAL SPINE WITHOUT CONTRAST TECHNIQUE: Multidetector CT imaging of the cervical spine was performed without intravenous contrast. Multiplanar CT image reconstructions were also generated. RADIATION DOSE REDUCTION: This exam was performed according to the departmental dose-optimization program which includes automated exposure control, adjustment of the mA and/or kV according to patient size and/or use of iterative reconstruction technique. COMPARISON:  None Available. FINDINGS: Alignment: Normal Skull base and vertebrae: Skull base intact. Vertebral body heights maintained. Disc space narrowing and endplate spur formation C4-C5 through C6-C7. Multilevel facet degenerative changes. No fracture, subluxation, or bone destruction. Soft tissues and spinal canal: Prevertebral soft tissues normal thickness. 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 No acute cervical spine abnormalities. Electronically Signed   By: Lavonia Dana M.D.   On: 06/12/2022 12:35    Procedures Procedures     Medications Ordered in ED Medications  fentaNYL (SUBLIMAZE) injection 50 mcg (50 mcg Intravenous Given 06/12/22 1201)    ED Course/ Medical Decision Making/ A&P                           Medical Decision Making Tahara Ruffini 66 year old female with history of migraines who presents with 2 weeks of worsening neck/shoulder/arm pain, numbness, and weakness.  No obvious mechanism of injury or arthropathy.  Exam was consistent with radicular pain.  Spoke to neurosurgery about best next steps.  Due to no prior imaging they recommend CT of the cervical spine without contrast.  CT of the cervical spine showed multilevel degenerative disc and facet disease but no acute abnormalities.  Patient will be treated with dual Dosepak and Zanaflex.  She will follow-up with Dr. Clarice Pole office for further work-up.  Discharged in stable condition and all questions were answered prior to departure.  Problems Addressed: Cervical radiculopathy: acute illness or injury Right arm pain: acute illness or injury  Amount and/or  Complexity of Data Reviewed Independent Historian: caregiver    Details: Daughter provided partial history and interpreted this visit. Labs: ordered. Decision-making details documented in ED Course. Radiology: ordered and independent interpretation performed. Decision-making details documented in ED Course.  Risk Prescription drug management.          Final Clinical Impression(s) / ED Diagnoses Final diagnoses:  Right arm pain  Cervical radiculopathy    Rx / DC Orders ED Discharge Orders          Ordered    methylPREDNISolone (MEDROL DOSEPAK) 4 MG TBPK tablet        06/12/22 1354    tiZANidine (ZANAFLEX) 4 MG tablet  Every 8 hours PRN        06/12/22 1354              Johny Blamer, DO 06/12/22 1405    Tegeler, Gwenyth Allegra, MD 06/13/22 1526

## 2022-06-12 NOTE — ED Notes (Signed)
Placed call to Dr. Ellene Route for Neurosurgery Consult per Dr. Sherry Ruffing

## 2022-06-12 NOTE — ED Notes (Signed)
ED Provider at bedside. 

## 2022-06-23 ENCOUNTER — Ambulatory Visit
Admit: 2022-06-23 | Discharge: 2022-06-23 | Disposition: A | Discharge status: Home / Self Care | Payer: Medicare Other | Attending: Family | Admitting: Family

## 2022-06-23 ENCOUNTER — Other Ambulatory Visit: Payer: Self-pay | Admitting: Family

## 2022-06-23 DIAGNOSIS — N6320 Unspecified lump in the left breast, unspecified quadrant: Secondary | ICD-10-CM

## 2022-06-23 DIAGNOSIS — R928 Other abnormal and inconclusive findings on diagnostic imaging of breast: Secondary | ICD-10-CM

## 2022-07-01 ENCOUNTER — Ambulatory Visit
Admission: RE | Admit: 2022-07-01 | Discharge: 2022-07-01 | Disposition: A | Discharge status: Home / Self Care | Payer: Medicare Other | Source: Ambulatory Visit | Attending: Family | Admitting: Family

## 2022-07-01 DIAGNOSIS — N6320 Unspecified lump in the left breast, unspecified quadrant: Secondary | ICD-10-CM

## 2022-07-02 ENCOUNTER — Telehealth: Payer: Self-pay | Admitting: Hematology and Oncology

## 2022-07-02 NOTE — Telephone Encounter (Signed)
Spoke with daughter to confirm afternoon clinic appointment for 8/9, sent her the paperwork as well

## 2022-07-04 ENCOUNTER — Encounter: Payer: Self-pay | Admitting: *Deleted

## 2022-07-07 ENCOUNTER — Other Ambulatory Visit: Payer: Self-pay | Admitting: *Deleted

## 2022-07-07 DIAGNOSIS — Z170 Estrogen receptor positive status [ER+]: Secondary | ICD-10-CM | POA: Insufficient documentation

## 2022-07-08 NOTE — Progress Notes (Signed)
Radiation Oncology         [phone] ________________________________  Multidisciplinary Breast Oncology Clinic [facility]) Initial Outpatient Consultation  Name: Chloe Reid MRN: [id number]  Date: 07/09/2022  DOB: [birth year]  CC:Reid, Chloe Sciara, NP  Stark Klein, MD   REFERRING PHYSICIAN: Stark Klein, MD  DIAGNOSIS: There were no encounter diagnoses.  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  HISTORY OF PRESENT ILLNESS::Chloe Reid is a 66 y.o. female who is presenting to the office today for evaluation of her newly diagnosed breast cancer. She is accompanied by ***. She is doing well overall.   She had routine screening mammography on 06/10/22 showing a possible abnormality in the left breast. She underwent unilateral left diagnostic mammography with tomography and left breast ultrasonography at [facility] on 06/23/22 showing: a highly suspicious irregular hypoechoic 2 o'clock left breast mass, 5 cmfn, measuring 10 x 7 x 9 mm. No axillary adenopathy was appreciated.  Biopsy of the 2 o'clock left breast mass on 07/01/22 showed: grade 2 invasive ductal carcinoma with intermediate grade DCIS measuring 1.2 cm in the greatest linear extent. Prognostic indicators significant for: estrogen receptor, 95% positive and progesterone receptor, 20% positive, both with strong staining intensity. Proliferation marker Ki67 at 5%. HER2 negative.  Menarche: *** years old Age at first live birth: *** years old GP: *** LMP: *** Contraceptive: *** HRT: ***   The patient was referred today for presentation in the multidisciplinary conference.  Radiology studies and pathology slides were presented there for review and discussion of treatment options.  A consensus was discussed regarding potential next steps.  PREVIOUS RADIATION THERAPY: {EXAM; YES/NO:19492::"No"}  PAST MEDICAL HISTORY:  Past Medical History:  Diagnosis  Date   Cataracts, bilateral    Glaucoma    Kidney stone    Macular drusen    Migraine    Uncontrolled diabetes mellitus type 2 without complications 4/70/9628   Formatting of this note might be different from the original. Last Assessment & Plan:  Stable, obtain a1c.    PAST SURGICAL HISTORY: Past Surgical History:  Procedure Laterality Date   uterine biopsy      FAMILY HISTORY:  Family History  Problem Relation Age of Onset   Heart disease Father    Kidney disease Father     SOCIAL HISTORY:  Social History   Socioeconomic History   Marital status: Married    Spouse name: Not on file   Number of children: Not on file   Years of education: Not on file   Highest education level: Not on file  Occupational History   Not on file  Tobacco Use   Smoking status: Never   Smokeless tobacco: Never  Substance and Sexual Activity   Alcohol use: No   Drug use: Not on file   Sexual activity: Not on file  Other Topics Concern   Not on file  Social History Narrative   Has 4 children- 3 daughter's 1 son, all live at home   Married   Cooks at [employer]   Enjoys television, cooking   No pets   Grew up in [place]- moved here in [place] Determinants of Health   Financial Resource Strain: Not on [employer] Insecurity: Not on file  Transportation Needs: Not on file  Physical Activity: Not on file  Stress: Not on file  Social Connections: Not on file    ALLERGIES: No Known  Allergies  MEDICATIONS:  Current Outpatient Medications  Medication Sig Dispense Refill   acetaminophen (TYLENOL) 500 MG tablet Take 1 tablet (500 mg total) by mouth every 6 (six) hours as needed. 30 tablet 0   calcium-vitamin D (OSCAL WITH D) 500-200 MG-UNIT tablet Take 1 tablet by mouth 3 (three) times daily. 90 tablet 12   lisinopril (ZESTRIL) 2.5 MG tablet Take 1 tablet (2.5 mg total) by mouth daily. 90 tablet 0   methylPREDNISolone (MEDROL DOSEPAK) 4 MG TBPK tablet Follow directions on package  21 tablet 0   Multiple Vitamin (MULTIVITAMIN) tablet Take 1 tablet by mouth daily.     Zoster Vaccine Adjuvanted [facility]) injection Inject 0.5 mcg IM now and again in 2-6 months. 0.5 mL 1   No current facility-administered medications for this encounter.    REVIEW OF SYSTEMS: A 10+ POINT REVIEW OF SYSTEMS WAS OBTAINED including neurology, dermatology, psychiatry, cardiac, respiratory, lymph, extremities, GI, GU, musculoskeletal, constitutional, reproductive, HEENT. On the provided form, she reports ***. She denies *** and any other symptoms.    PHYSICAL EXAM:  vitals were not taken for this visit.  {may need to copy over vitals} Lungs are clear to auscultation bilaterally. Heart has regular rate and rhythm. No palpable cervical, supraclavicular, or axillary adenopathy. Abdomen soft, non-tender, normal bowel sounds. Breast: *** breast with no palpable mass, nipple discharge, or bleeding. *** breast with ***.   KPS = ***  100 - Normal; no complaints; no evidence of disease. 90   - Able to carry on normal activity; minor signs or symptoms of disease. 80   - Normal activity with effort; some signs or symptoms of disease. 39   - Cares for self; unable to carry on normal activity or to do active work. 60   - Requires occasional assistance, but is able to care for most of his personal needs. 50   - Requires considerable assistance and frequent medical care. 43   - Disabled; requires special care and assistance. 15   - Severely disabled; hospital admission is indicated although death not imminent. 37   - Very sick; hospital admission necessary; active supportive treatment necessary. 10   - Moribund; fatal processes progressing rapidly. 0     - Dead  Karnofsky DA, Abelmann Callahan, Craver LS and Burchenal [facility] [phone]) The use of the nitrogen mustards in the palliative treatment of carcinoma: with particular reference to bronchogenic carcinoma Cancer 1 634-56  LABORATORY DATA:  Lab Results  Component  Value Date   WBC 9.6 06/12/2022   HGB 12.7 06/12/2022   HCT 40.3 06/12/2022   MCV 78.6 (L) 06/12/2022   PLT 272 06/12/2022   Lab Results  Component Value Date   NA 138 06/12/2022   K 4.3 06/12/2022   CL 106 06/12/2022   CO2 24 06/12/2022   Lab Results  Component Value Date   ALT 17 06/12/2022   AST 19 06/12/2022   ALKPHOS 60 06/12/2022   BILITOT 0.7 06/12/2022    PULMONARY FUNCTION TEST:   Review Flowsheet        No data to display          RADIOGRAPHY: [place] LT BREAST BX W LOC DEV 1ST LESION IMG BX SPEC US GUIDE  Addendum Date: 07/03/2022   ADDENDUM REPORT: 07/03/2022 12:58 ADDENDUM: Pathology revealed GRADE II INVASIVE DUCTAL CARCINOMA, DUCTAL CARCINOMA IN SITU, INTERMEDIATE GRADE of the LEFT breast, 2:00 5 cmfn, (ribbon clip). This was found to be concordant by Dr. Ammie Ferrier. Pathology results were  discussed with the patient and her daughter, Programmer, systems by telephone, along with [employer]. The patient reported doing well after the biopsy with tenderness at the site. Post biopsy instructions and care were reviewed and questions were answered. The patient was encouraged to call [facility] for any additional concerns. My direct phone number was provided to [facility]. The patient was referred to [facility] on July 09, 2022. Pathology results reported by Terie Purser, RN on 07/03/2022. Electronically Signed   By: Ammie Ferrier M.D.   On: 07/03/2022 12:58   Result Date: 07/03/2022 CLINICAL DATA:  66 year old female presenting for ultrasound-guided biopsy of a left breast mass. EXAM: ULTRASOUND GUIDED LEFT BREAST CORE NEEDLE BIOPSY COMPARISON:  Previous exam(s). PROCEDURE: I met with the patient and we discussed the procedure of ultrasound-guided biopsy, including benefits and alternatives. We discussed the high likelihood of a successful procedure. We discussed  the risks of the procedure, including infection, bleeding, tissue injury, clip migration, and inadequate sampling. Informed written consent was given. The usual time-out protocol was performed immediately prior to the procedure. Lesion quadrant: Upper outer quadrant Using sterile technique and 1% Lidocaine as local anesthetic, under direct ultrasound visualization, a 14 gauge spring-loaded device was used to perform biopsy of a mass in the left breast at 2 o'clock, 5 cm from the nipple using a lateral approach. At the conclusion of the procedure a ribbon shaped tissue marker clip was deployed into the biopsy cavity. Follow up 2 view mammogram was performed and dictated separately. IMPRESSION: Ultrasound guided biopsy of a left breast mass at 2 o'clock. No apparent complications. Electronically Signed: By: Ammie Ferrier M.D. On: 07/01/2022 12:24  MM CLIP PLACEMENT LEFT  Result Date: 07/01/2022 CLINICAL DATA:  Post biopsy mammogram of the left breast for clip placement. EXAM: 3D DIAGNOSTIC LEFT MAMMOGRAM POST ULTRASOUND BIOPSY COMPARISON:  Previous exam(s). FINDINGS: 3D Mammographic images were obtained following ultrasound guided biopsy of a left breast mass at 2 o'clock. The biopsy marking clip is in expected position at the site of biopsy. IMPRESSION: Appropriate positioning of the ribbon shaped biopsy marking clip at the site of biopsy in the upper-outer left breast. Final Assessment: Post Procedure Mammograms for Marker Placement Electronically Signed   By: Ammie Ferrier M.D.   On: 07/01/2022 12:33  MM DIAG BREAST TOMO UNI LEFT  Result Date: 06/23/2022 CLINICAL DATA:  The pace she was called back for a left breast mass. EXAM: DIGITAL DIAGNOSTIC UNILATERAL LEFT MAMMOGRAM WITH TOMOSYNTHESIS AND CAD; ULTRASOUND LEFT BREAST LIMITED TECHNIQUE: Left digital diagnostic mammography and breast tomosynthesis was performed. The images were evaluated with computer-aided detection.; Targeted ultrasound  examination of the left breast was performed. COMPARISON:  Previous exam(s). ACR Breast Density Category b: There are scattered areas of fibroglandular density. FINDINGS: The mass in the upper outer left breast persists on today's imaging. Targeted ultrasound is performed, showing an irregular hypoechoic mass with posterior shadowing at 2 o'clock, 5 cm from the nipple measuring 10 by 7 x 9 mm. No axillary adenopathy. IMPRESSION: Highly suspicious left breast mass.  No adenopathy identified. RECOMMENDATION: Recommend ultrasound-guided biopsy of the highly suspicious left breast mass at 2 o'clock. I have discussed the findings and recommendations with the patient. If applicable, a reminder letter will be sent to the patient regarding the next appointment. BI-RADS CATEGORY  5: Highly suggestive of malignancy. Electronically Signed   By: Dorise Bullion III M.D.  On: 06/23/2022 11:24  US BREAST LTD UNI LEFT INC AXILLA  Result Date: 06/23/2022 CLINICAL DATA:  The pace she was called back for a left breast mass. EXAM: DIGITAL DIAGNOSTIC UNILATERAL LEFT MAMMOGRAM WITH TOMOSYNTHESIS AND CAD; ULTRASOUND LEFT BREAST LIMITED TECHNIQUE: Left digital diagnostic mammography and breast tomosynthesis was performed. The images were evaluated with computer-aided detection.; Targeted ultrasound examination of the left breast was performed. COMPARISON:  Previous exam(s). ACR Breast Density Category b: There are scattered areas of fibroglandular density. FINDINGS: The mass in the upper outer left breast persists on today's imaging. Targeted ultrasound is performed, showing an irregular hypoechoic mass with posterior shadowing at 2 o'clock, 5 cm from the nipple measuring 10 by 7 x 9 mm. No axillary adenopathy. IMPRESSION: Highly suspicious left breast mass.  No adenopathy identified. RECOMMENDATION: Recommend ultrasound-guided biopsy of the highly suspicious left breast mass at 2 o'clock. I have discussed the findings and  recommendations with the patient. If applicable, a reminder letter will be sent to the patient regarding the next appointment. BI-RADS CATEGORY  5: Highly suggestive of malignancy. Electronically Signed   By: Dorise Bullion III M.D.   On: 06/23/2022 11:24  CT Cervical Spine Wo Contrast  Result Date: 06/12/2022 CLINICAL DATA:  Neck pain, spondyloarthropathy EXAM: CT CERVICAL SPINE WITHOUT CONTRAST TECHNIQUE: Multidetector CT imaging of the cervical spine was performed without intravenous contrast. Multiplanar CT image reconstructions were also generated. RADIATION DOSE REDUCTION: This exam was performed according to the departmental dose-optimization program which includes automated exposure control, adjustment of the mA and/or kV according to patient size and/or use of iterative reconstruction technique. COMPARISON:  None Available. FINDINGS: Alignment: Normal Skull base and vertebrae: Skull base intact. Vertebral body heights maintained. Disc space narrowing and endplate spur formation C4-C5 through C6-C7. Multilevel facet degenerative changes. No fracture, subluxation, or bone destruction. Soft tissues and spinal canal: Prevertebral soft tissues normal thickness. 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 No acute cervical spine abnormalities. Electronically Signed   By: Lavonia Dana M.D.   On: 06/12/2022 12:35   MM 3D SCREEN BREAST BILATERAL  Result Date: 06/11/2022 CLINICAL DATA:  Screening. EXAM: DIGITAL SCREENING BILATERAL MAMMOGRAM WITH TOMOSYNTHESIS AND CAD TECHNIQUE: Bilateral screening digital craniocaudal and mediolateral oblique mammograms were obtained. Bilateral screening digital breast tomosynthesis was performed. The images were evaluated with computer-aided detection. COMPARISON:  Previous exam(s). ACR Breast Density Category b: There are scattered areas  of fibroglandular density. FINDINGS: In the left breast, a possible mass warrants further evaluation. In the right breast, no findings suspicious for malignancy. IMPRESSION: Further evaluation is suggested for a possible mass in the left breast. RECOMMENDATION: Diagnostic mammogram and possibly ultrasound of the left breast. (Code:[id number]) The patient will be contacted regarding the findings, and additional imaging will be scheduled. BI-RADS CATEGORY  0: Incomplete. Need additional imaging evaluation and/or prior mammograms for comparison. Electronically Signed   By: Nolon Nations M.D.   On: 06/11/2022 15:04      IMPRESSION: ***  Patient will be a good candidate for breast conservation with radiotherapy to the left breast. We discussed the general course of radiation, potential side effects, and toxicities with radiation and the patient is interested in this approach. ***   PLAN:  ***   ------------------------------------------------  Blair Promise, PhD, MD  This document serves as a record of services personally performed by Gery Pray, MD. It was  created on his behalf by Roney Mans, a trained medical scribe. The creation of this record is based on the scribe's personal observations and the provider's statements to them. This document has been checked and approved by the attending provider.

## 2022-07-09 ENCOUNTER — Ambulatory Visit: Payer: Medicare Other | Attending: General Surgery | Admitting: Physical Therapy

## 2022-07-09 ENCOUNTER — Ambulatory Visit
Admission: RE | Admit: 2022-07-09 | Discharge: 2022-07-09 | Disposition: A | Discharge status: Home / Self Care | Payer: Medicare Other | Source: Ambulatory Visit | Attending: Radiation Oncology | Admitting: Radiation Oncology

## 2022-07-09 ENCOUNTER — Other Ambulatory Visit: Payer: Self-pay

## 2022-07-09 ENCOUNTER — Telehealth: Payer: Self-pay | Admitting: Genetic Counselor

## 2022-07-09 ENCOUNTER — Other Ambulatory Visit: Payer: Self-pay | Admitting: General Surgery

## 2022-07-09 ENCOUNTER — Encounter: Payer: Self-pay | Admitting: Physical Therapy

## 2022-07-09 ENCOUNTER — Inpatient Hospital Stay: Payer: Medicare Other

## 2022-07-09 ENCOUNTER — Encounter: Payer: Self-pay | Admitting: Hematology and Oncology

## 2022-07-09 ENCOUNTER — Inpatient Hospital Stay: Payer: Medicare Other | Attending: Hematology and Oncology | Admitting: Hematology and Oncology

## 2022-07-09 DIAGNOSIS — C50412 Malignant neoplasm of upper-outer quadrant of left female breast: Secondary | ICD-10-CM | POA: Insufficient documentation

## 2022-07-09 DIAGNOSIS — Z170 Estrogen receptor positive status [ER+]: Secondary | ICD-10-CM | POA: Diagnosis present

## 2022-07-09 DIAGNOSIS — R293 Abnormal posture: Secondary | ICD-10-CM | POA: Insufficient documentation

## 2022-07-09 LAB — CBC WITH DIFFERENTIAL (CANCER CENTER ONLY)
Abs Immature Granulocytes: 0.02 10*3/uL (ref 0.00–0.07)
Basophils Absolute: 0 10*3/uL (ref 0.0–0.1)
Basophils Relative: 0 %
Eosinophils Absolute: 0.2 10*3/uL (ref 0.0–0.5)
Eosinophils Relative: 2 %
HCT: 36.5 % (ref 36.0–46.0)
Hemoglobin: 12 g/dL (ref 12.0–15.0)
Immature Granulocytes: 0 %
Lymphocytes Relative: 30 %
Lymphs Abs: 2.9 10*3/uL (ref 0.7–4.0)
MCH: 25.4 pg — ABNORMAL LOW (ref 26.0–34.0)
MCHC: 32.9 g/dL (ref 30.0–36.0)
MCV: 77.3 fL — ABNORMAL LOW (ref 80.0–100.0)
Monocytes Absolute: 0.7 10*3/uL (ref 0.1–1.0)
Monocytes Relative: 8 %
Neutro Abs: 5.6 10*3/uL (ref 1.7–7.7)
Neutrophils Relative %: 60 %
Platelet Count: 272 10*3/uL (ref 150–400)
RBC: 4.72 MIL/uL (ref 3.87–5.11)
RDW: 14.7 % (ref 11.5–15.5)
WBC Count: 9.5 10*3/uL (ref 4.0–10.5)
nRBC: 0 % (ref 0.0–0.2)

## 2022-07-09 LAB — CMP (CANCER CENTER ONLY)
ALT: 16 U/L (ref 0–44)
AST: 16 U/L (ref 15–41)
Albumin: 4 g/dL (ref 3.5–5.0)
Alkaline Phosphatase: 59 U/L (ref 38–126)
Anion gap: 7 (ref 5–15)
BUN: 13 mg/dL (ref 8–23)
CO2: 26 mmol/L (ref 22–32)
Calcium: 9.4 mg/dL (ref 8.9–10.3)
Chloride: 105 mmol/L (ref 98–111)
Creatinine: 0.66 mg/dL (ref 0.44–1.00)
GFR, Estimated: >60 mL/min (ref >60)
Glucose, Bld: 96 mg/dL (ref 70–99)
Potassium: 4 mmol/L (ref 3.5–5.1)
Sodium: 138 mmol/L (ref 135–145)
Total Bilirubin: 0.4 mg/dL (ref 0.3–1.2)
Total Protein: 8.1 g/dL (ref 6.5–8.1)

## 2022-07-09 LAB — GENETIC SCREENING ORDER

## 2022-07-09 NOTE — Therapy (Signed)
OUTPATIENT PHYSICAL THERAPY BREAST CANCER BASELINE EVALUATION   Patient Name: Chloe Reid MRN: [id number] DOB:[birth year], 66 y.o., female Today's Date: 07/09/2022   PT End of Session - 07/09/22 1545     Visit Number 1    Number of Visits 2    Date for PT Re-Evaluation 09/03/22    PT Start Time 1353    PT Stop Time 1402   Also saw pt from 1415-1432 for a total of 26 min   PT Time Calculation (min) 9 min    Activity Tolerance Patient tolerated treatment well    Behavior During Therapy [facility] for tasks assessed/performed             Past Medical History:  Diagnosis Date   Breast cancer ([place])    Cataracts, bilateral    Glaucoma    Kidney stone    Macular drusen    Migraine    Uncontrolled diabetes mellitus type 2 without complications 78/29/5621   Formatting of this note might be different from the original. Last Assessment & Plan:  Stable, obtain a1c.   Past Surgical History:  Procedure Laterality Date   uterine biopsy     Patient Active Problem List   Diagnosis Date Noted   Malignant neoplasm of upper-outer quadrant of left breast in female, estrogen receptor positive ([place]) 07/07/2022   Microalbuminuria due to type 2 diabetes mellitus ([place]) 05/14/2022   Macular drusen    Kidney stone    Glaucoma    Cataracts, bilateral    Acute pain of left knee 06/05/2017   Onychomycosis 07/09/2014   Chest pain 03/24/2014   Urinary incontinence 03/24/2014   Controlled type 2 diabetes mellitus with microalbuminuria, without long-term current use of insulin ([place]) 03/13/2014   Hyperlipidemia 03/13/2014   Routine general medical examination at a health care facility 03/10/2014   Migraine 03/10/2014    REFERRING PROVIDER: Dr. Stark Klein  REFERRING DIAG: Left breast cancer  THERAPY DIAG:  Malignant neoplasm of upper-outer quadrant of left breast in female, estrogen receptor positive ([place])  Abnormal posture  Rationale for Evaluation and Treatment Rehabilitation  ONSET DATE:  06/10/2022  SUBJECTIVE                                                                                                                                                                                           SUBJECTIVE STATEMENT: Patient reports she is here today to be seen by her medical team for her newly diagnosed left breast cancer.   PERTINENT HISTORY:  Patient was diagnosed on 06/10/2022 with left grade 2 invasive ductal carcinoma breast cancer. It measures 1 cm and is located in  the upper outer quadrant. It is ER/PR positive and HER2 negative with a Ki67 of 5%.   PATIENT GOALS   reduce lymphedema risk and learn post op HEP.   PAIN:  Are you having pain? No   PRECAUTIONS: Active CA   HAND DOMINANCE: right  WEIGHT BEARING RESTRICTIONS No  FALLS:  Has patient fallen in last 6 months? No  LIVING ENVIRONMENT: Patient lives with: her 2 adult children Lives in: House/apartment Has following equipment at home: None  OCCUPATION: Retired  LEISURE: She does not exercise  PRIOR LEVEL OF FUNCTION: Independent   OBJECTIVE  COGNITION:  Overall cognitive status: Within functional limits for tasks assessed    POSTURE:  Forward head and rounded shoulders posture  UPPER EXTREMITY AROM/PROM:  A/PROM RIGHT   eval   Shoulder extension 55  Shoulder flexion 151  Shoulder abduction 155  Shoulder internal rotation 67  Shoulder external rotation 87    (Blank rows = not tested)  A/PROM LEFT   eval  Shoulder extension 51  Shoulder flexion 150  Shoulder abduction 172  Shoulder internal rotation 75  Shoulder external rotation 85    (Blank rows = not tested)   CERVICAL AROM: All within normal limits   UPPER EXTREMITY STRENGTH: WNL   LYMPHEDEMA ASSESSMENTS:   LANDMARK RIGHT   eval  10 cm proximal to olecranon process 27.8  Olecranon process 24.6  10 cm proximal to ulnar styloid process 20.7  Just proximal to ulnar styloid process 14.8  Across hand at thumb web  space 18.7  At base of 2nd digit 6  (Blank rows = not tested)  LANDMARK LEFT   eval  10 cm proximal to olecranon process 28.5  Olecranon process 25.5  10 cm proximal to ulnar styloid process 20.7  Just proximal to ulnar styloid process 15.3  Across hand at thumb web space 19.1  At base of 2nd digit 5.8  (Blank rows = not tested)   L-DEX LYMPHEDEMA SCREENING:  The patient was assessed using the L-Dex machine today to produce a lymphedema index baseline score. The patient will be reassessed on a regular basis (typically every 3 months) to obtain new L-Dex scores. If the score is > 6.5 points away from his/her baseline score indicating onset of subclinical lymphedema, it will be recommended to wear a compression garment for 4 weeks, 12 hours per day and then be reassessed. If the score continues to be > 6.5 points from baseline at reassessment, we will initiate lymphedema treatment. Assessing in this manner has a 95% rate of preventing clinically significant lymphedema.   L-DEX FLOWSHEETS - 07/09/22 1500       L-DEX LYMPHEDEMA SCREENING   Measurement Type Unilateral    L-DEX MEASUREMENT EXTREMITY Upper Extremity    POSITION  Standing    DOMINANT SIDE Right    At Risk Side Left    BASELINE SCORE (UNILATERAL) 2.9              QUICK DASH SURVEY:  Katina Dung - 07/09/22 0001     Open a tight or new jar Mild difficulty    Do heavy household chores (wash walls, wash floors) Moderate difficulty    Carry a shopping bag or briefcase Moderate difficulty    Wash your back Unable    Use a knife to cut food No difficulty    Recreational activities in which you take some force or impact through your arm, shoulder, or hand (golf, hammering, tennis) Severe difficulty  During the past week, to what extent has your arm, shoulder or hand problem interfered with your normal social activities with family, friends, neighbors, or groups? Modererately    During the past week, to what extent has  your arm, shoulder or hand problem limited your work or other regular daily activities Quite a bit    Arm, shoulder, or hand pain. None    Tingling (pins and needles) in your arm, shoulder, or hand None    Difficulty Sleeping No difficulty    DASH Score 38.64 %              PATIENT EDUCATION:  Education details: Lymphedema risk reduction and post op shoulder/posture HEP Person educated: Patient Education method: Explanation, Demonstration, Handout Education comprehension: Patient verbalized understanding and returned demonstration   HOME EXERCISE PROGRAM: Patient was instructed today in a home exercise program today for post op shoulder range of motion. These included active assist shoulder flexion in sitting, scapular retraction, wall walking with shoulder abduction, and hands behind head external rotation.  She was encouraged to do these twice a day, holding 3 seconds and repeating 5 times when permitted by her physician.   ASSESSMENT:  CLINICAL IMPRESSION: Patient was diagnosed on 06/10/2022 with left grade 2 invasive ductal carcinoma breast cancer. It measures 1 cm and is located in the upper outer quadrant. It is ER/PR positive and HER2 negative with a Ki67 of 5%. Her multidisciplinary medical team met prior to her assessments to determine a recommended treatment plan. She is planning to have a left lumpectomy and sentinel node biopsy followed by Oncotype testing, radiation, and anti-estrogen therapy. She will benefit from a post op PT reassessment to determine needs and from L-Dex screens every 3 months for 2 years to detect subclinical lymphedema.  Pt will benefit from skilled therapeutic intervention to improve on the following deficits: Decreased knowledge of precautions, impaired UE functional use, pain, decreased ROM, postural dysfunction.   PT treatment/interventions: ADL/self-care home management, pt/family education, therapeutic exercise  REHAB POTENTIAL:  Excellent  CLINICAL DECISION MAKING: Stable/uncomplicated  EVALUATION COMPLEXITY: Low   GOALS: Goals reviewed with patient? YES  LONG TERM GOALS: (STG=LTG)    Name Target Date Goal status  1 Pt will be able to verbalize understanding of pertinent lymphedema risk reduction practices relevant to her dx specifically related to skin care.  Baseline:  No knowledge 07/09/2022 Achieved at eval  2 Pt will be able to return demo and/or verbalize understanding of the post op HEP related to regaining shoulder ROM. Baseline:  No knowledge 07/09/2022 Achieved at eval  3 Pt will be able to verbalize understanding of the importance of attending the post op After Breast CA Class for further lymphedema risk reduction education and therapeutic exercise.  Baseline:  No knowledge 07/09/2022 Achieved at eval  4 Pt will demo she has regained full shoulder ROM and function post operatively compared to baselines.  Baseline: See objective measurements taken today. 09/03/2022      PLAN: PT FREQUENCY/DURATION: EVAL and 1 follow up appointment.   PLAN FOR NEXT SESSION: will reassess 3-4 weeks post op to determine needs.   Patient will follow up at outpatient cancer rehab 3-4 weeks following surgery.  If the patient requires physical therapy at that time, a specific plan will be dictated and sent to the referring physician for approval. The patient was educated today on appropriate basic range of motion exercises to begin post operatively and the importance of attending the After Breast Cancer class following  surgery.  Patient was educated today on lymphedema risk reduction practices as it pertains to recommendations that will benefit the patient immediately following surgery.  She verbalized good understanding.    Physical Therapy Information for After Breast Cancer Surgery/Treatment:  Lymphedema is a swelling condition that you may be at risk for in your arm if you have lymph nodes removed from the armpit area.   After a sentinel node biopsy, the risk is approximately 5-9% and is higher after an axillary node dissection.  There is treatment available for this condition and it is not life-threatening.  Contact your physician or physical therapist with concerns. You may begin the 4 shoulder/posture exercises (see additional sheet) when permitted by your physician (typically a week after surgery).  If you have drains, you may need to wait until those are removed before beginning range of motion exercises.  A general recommendation is to not lift your arms above shoulder height until drains are removed.  These exercises should be done to your tolerance and gently.  This is not a "no pain/no gain" type of recovery so listen to your body and stretch into the range of motion that you can tolerate, stopping if you have pain.  If you are having immediate reconstruction, ask your plastic surgeon about doing exercises as he or she may want you to wait. We encourage you to attend the free one time ABC (After Breast Cancer) class offered by [facility].  You will learn information related to lymphedema risk, prevention and treatment and additional exercises to regain mobility following surgery.  You can call [phone] for more information.  This is offered the 1st and 3rd Monday of each month.  You only attend the class one time. While undergoing any medical procedure or treatment, try to avoid blood pressure being taken or needle sticks from occurring on the arm on the side of cancer.   This recommendation begins after surgery and continues for the rest of your life.  This may help reduce your risk of getting lymphedema (swelling in your arm). An excellent resource for those seeking information on lymphedema is the National Lymphedema Network's web site. It can be accessed at [place].org If you notice swelling in your hand, arm or breast at any time following surgery (even if it is many years from  now), please contact your doctor or physical therapist to discuss this.  Lymphedema can be treated at any time but it is easier for you if it is treated early on.  If you feel like your shoulder motion is not returning to normal in a reasonable amount of time, please contact your surgeon or physical therapist.  [facility] [phone]. [address], [address], [place] [address]  ABC CLASS After Breast Cancer Class  After Breast Cancer Class is a specially designed exercise class to assist you in a safe recover after having breast cancer surgery.  In this class you will learn how to get back to full function whether your drains were just removed or if you had surgery a month ago.  This one-time class is held the 1st and 3rd Monday of every month from 11:00 a.m. until 12:00 noon virtually.  This class is FREE and space is limited. For more information or to register for the next available class, call [phone].  Class Goals  Understand specific stretches to improve the flexibility of you chest and shoulder. Learn ways to safely strengthen your upper body  and improve your posture. Understand the warning signs of infection and why you may be at risk for an arm infection. Learn about Lymphedema and prevention.  ** You do not attend this class until after surgery.  Drains must be removed to participate  Patient was instructed today in a home exercise program today for post op shoulder range of motion. These included active assist shoulder flexion in sitting, scapular retraction, wall walking with shoulder abduction, and hands behind head external rotation.  She was encouraged to do these twice a day, holding 3 seconds and repeating 5 times when permitted by her physician.    Mammie Meras,MARTI COOPER, PT 07/09/2022, 3:52 PM

## 2022-07-09 NOTE — Assessment & Plan Note (Signed)
07/01/2022:Screening mammogram detected left breast mass at 2 o'clock position 1 cm in size.  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 After reviewing the pathology in detail, we proceeded to discuss the different treatment options between surgery, radiation, chemotherapy, antiestrogen therapies.  Recommendations: 1. Breast conserving surgery followed by 2. Oncotype DX testing to determine if chemotherapy would be of any benefit followed by 3. Adjuvant radiation therapy followed by 4. Adjuvant antiestrogen therapy  Oncotype counseling: I discussed Oncotype DX test. I explained to the patient that this is a 21 gene panel to evaluate patient tumors DNA to calculate recurrence score. This would help determine whether patient has high risk or low risk breast cancer. She understands that if her tumor was found to be high risk, she would benefit from systemic chemotherapy. If low risk, no need of chemotherapy.  Return to clinic after surgery to discuss final pathology report and then determine if Oncotype DX testing will need to be sent.

## 2022-07-09 NOTE — Progress Notes (Signed)
[facility] NOTE  Patient Care Team: Debbrah Alar, NP as PCP - General (Internal Medicine) Stark Klein, MD as Consulting Physician (General Surgery) [name], MD as Consulting Physician (Hematology and Oncology) Gery Pray, MD as Consulting Physician (Radiation Oncology) Rockwell Germany, RN as Oncology Nurse Navigator Mauro Kaufmann, RN as Oncology Nurse Navigator  CHIEF COMPLAINTS/PURPOSE OF CONSULTATION:  Newly diagnosed breast cancer  HISTORY OF PRESENTING ILLNESS:  Chloe Reid 66 y.o. female is here because of recent diagnosis of left breast cancer.  She had a routine screening mammogram that detected left breast mass at 2 o'clock position 1 cm in size.  Ultrasound guided biopsy revealed grade 2 invasive ductal carcinoma with DCIS that was ER/PR positive HER2 negative with a Ki-67 of 5%.  She was presented this morning to the multidisciplinary tumor board and she is here today accompanied by her daughter to discuss her treatment plan.  I reviewed her records extensively and collaborated the history with the patient.  SUMMARY OF ONCOLOGIC HISTORY: Oncology History  Malignant neoplasm of upper-outer quadrant of left breast in female, estrogen receptor positive ([place])  07/01/2022 Initial Diagnosis   Screening mammogram detected left breast mass at 2 o'clock position 1 cm in size.  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 Last Assessment & Plan:  Stable, obtain a1c.    SURGICAL HISTORY: Past Surgical History:  Procedure Laterality Date   uterine  biopsy      SOCIAL HISTORY: Social History   Socioeconomic History   Marital status: Married    Spouse name: Not on file   Number of children: Not on file   Years of education: Not on file   Highest education level: Not on file  Occupational History   Not on file  Tobacco Use   Smoking status: Never   Smokeless tobacco: Never  Substance and Sexual Activity   Alcohol use: No   Drug use: Never   Sexual activity: Not on file  Other Topics Concern   Not on file  Social History Narrative   Has 4 children- 3 daughter's 1 son, all live at home   Married   Cooks at [employer]   Enjoys television, cooking   No pets   Grew up in [place]- moved here in [place] Determinants of Health   Financial Resource Strain: Not on [employer] Insecurity: Not on file  Transportation Needs: Not on file  Physical Activity: Not on file  Stress: Not on file  Social Connections: Not on file  Intimate Partner Violence: Not on file    FAMILY HISTORY: Family History  Problem Relation Age of Onset   Heart disease Father    Kidney disease Father     ALLERGIES:  has No Known Allergies.  MEDICATIONS:  Current Outpatient Medications  Medication Sig Dispense Refill   acetaminophen (TYLENOL) 500 MG tablet Take 1 tablet (500 mg total) by mouth every 6 (six) hours as needed. 30 tablet 0   calcium-vitamin D (OSCAL WITH D) 500-200 MG-UNIT  tablet Take 1 tablet by mouth 3 (three) times daily. 90 tablet 12   lisinopril (ZESTRIL) 2.5 MG tablet Take 1 tablet (2.5 mg total) by mouth daily. 90 tablet 0   methylPREDNISolone (MEDROL DOSEPAK) 4 MG TBPK tablet Follow directions on package 21 tablet 0   Multiple Vitamin (MULTIVITAMIN) tablet Take 1 tablet by mouth daily.     Zoster Vaccine Adjuvanted [facility]) injection Inject 0.5 mcg IM now and again in 2-6 months. 0.5 mL 1   No current facility-administered medications for this visit.    REVIEW OF SYSTEMS:   Constitutional: Denies fevers, chills or  abnormal night sweats Breast:  Denies any palpable lumps or discharge All other systems were reviewed with the patient and are negative.  PHYSICAL EXAMINATION: ECOG PERFORMANCE STATUS: 0 - Asymptomatic  Vitals:   07/09/22 1316  BP: (!) 152/79  Pulse: 79  Resp: 18  Temp: 97.9 F (36.6 C)  SpO2: 97%   Filed Weights   07/09/22 1316  Weight: 153 lb 9.6 oz (69.7 kg)    GENERAL:alert, no distress and comfortable    LABORATORY DATA:  I have reviewed the data as listed Lab Results  Component Value Date   WBC 9.5 07/09/2022   HGB 12.0 07/09/2022   HCT 36.5 07/09/2022   MCV 77.3 (L) 07/09/2022   PLT 272 07/09/2022   Lab Results  Component Value Date   NA 138 07/09/2022   K 4.0 07/09/2022   CL 105 07/09/2022   CO2 26 07/09/2022    RADIOGRAPHIC STUDIES: I have personally reviewed the radiological reports and agreed with the findings in the report.  ASSESSMENT AND PLAN:  Malignant neoplasm of upper-outer quadrant of left breast in female, estrogen receptor positive ([place]) 07/01/2022:Screening mammogram detected left breast mass at 2 o'clock position 1 cm in size.  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 After reviewing the pathology in detail, we proceeded to discuss the different treatment options between surgery, radiation, chemotherapy, antiestrogen therapies.  Recommendations: 1. Breast conserving surgery followed by 2. Oncotype DX testing to determine if chemotherapy would be of any benefit followed by 3. Adjuvant radiation therapy followed by 4. Adjuvant antiestrogen therapy  Oncotype counseling: I discussed Oncotype DX test. I explained to the  patient that this is a 21 gene panel to evaluate patient tumors DNA to calculate recurrence score. This would help determine whether patient has high risk or low risk breast cancer. She understands that if her tumor was found to be high risk, she would benefit from systemic chemotherapy. If low risk, no need of chemotherapy.  Return to clinic after surgery to discuss final pathology report and then determine if Oncotype DX testing will need to be sent.    All questions were answered. The patient knows to call the clinic with any problems, questions or concerns.    Harriette Ohara, MD 07/09/22

## 2022-07-09 NOTE — Telephone Encounter (Signed)
Chloe Reid was seen by a genetic counselor during the breast cancer multidisciplinary clinic on 07/09/2022. In addition to her personal history of breast cancer, she reported no family history of cancer. She does not meet NCCN criteria for genetic testing at this time. She was still offered genetic counseling and testing but declined. We encourage her to contact us if there are any changes to her personal or family history of cancer. If she meets NCCN criteria based on the updated personal/family history, she would be recommended to have genetic counseling and testing.   Lucille Passy, MS, [facility] Genetic Counselor [email or web address] [phone]) [phone]

## 2022-07-10 ENCOUNTER — Other Ambulatory Visit: Payer: Self-pay | Admitting: General Surgery

## 2022-07-10 DIAGNOSIS — C50412 Malignant neoplasm of upper-outer quadrant of left female breast: Secondary | ICD-10-CM

## 2022-07-11 ENCOUNTER — Encounter: Payer: Self-pay | Admitting: *Deleted

## 2022-07-15 ENCOUNTER — Other Ambulatory Visit: Payer: Self-pay

## 2022-07-15 ENCOUNTER — Encounter: Payer: Self-pay | Admitting: *Deleted

## 2022-07-15 ENCOUNTER — Encounter (HOSPITAL_BASED_OUTPATIENT_CLINIC_OR_DEPARTMENT_OTHER): Payer: Self-pay | Admitting: General Surgery

## 2022-07-15 ENCOUNTER — Telehealth: Payer: Self-pay | Admitting: *Deleted

## 2022-07-15 NOTE — Telephone Encounter (Signed)
Left vm regarding [facility] from 07/09/22. Contact information provided for questions or needs.

## 2022-07-18 ENCOUNTER — Encounter: Payer: Self-pay | Admitting: General Practice

## 2022-07-18 NOTE — Progress Notes (Signed)
[facility] Psychosocial Distress Screening Spiritual Care  Left voicemail for Geraline with assistance from [facility] following Breast Multidisciplinary Clinic to introduce [facility] team/resources, reviewing distress screen per protocol.  The patient scored a 7 on the Psychosocial Distress Thermometer which indicates severe distress.    07/18/2022  ONCBCN DISTRESS SCREENING   Screening Type Initial Screening   Distress experienced in past week (1-10) 7 !   Family Problem type Partner;Children   Emotional problem type Nervousness/Anxiety;Isolation/feeling alone   Physical Problem type Pain   Referral to support programs Yes    In addition to [place] Interpreter's leaving voicemail in Urdu for Ms Lukes, I reached her daughter Ms Dimas Millin at the same number. Ms Mazher accompanied her mother to [facility].  Follow up needed: No. Ensured that family is aware of ongoing [facility] availability in case they would value a conversation partner about meaning-making, coping, or other emotional/spiritual/social needs.   [place], [place], [facility] Pager [phone] Voicemail [phone]

## 2022-07-21 ENCOUNTER — Ambulatory Visit
Admission: RE | Admit: 2022-07-21 | Discharge: 2022-07-21 | Disposition: A | Discharge status: Home / Self Care | Payer: Medicare Other | Source: Ambulatory Visit | Attending: General Surgery | Admitting: General Surgery

## 2022-07-21 DIAGNOSIS — C50412 Malignant neoplasm of upper-outer quadrant of left female breast: Secondary | ICD-10-CM

## 2022-07-22 ENCOUNTER — Encounter: Payer: Self-pay | Admitting: *Deleted

## 2022-07-22 DIAGNOSIS — R7303 Prediabetes: Secondary | ICD-10-CM

## 2022-07-29 ENCOUNTER — Encounter: Payer: Self-pay | Admitting: *Deleted

## 2022-08-08 ENCOUNTER — Inpatient Hospital Stay: Payer: Medicare Other | Admitting: Hematology and Oncology

## 2022-08-12 ENCOUNTER — Encounter (HOSPITAL_BASED_OUTPATIENT_CLINIC_OR_DEPARTMENT_OTHER): Payer: Self-pay | Admitting: General Surgery

## 2022-08-12 ENCOUNTER — Ambulatory Visit (INDEPENDENT_AMBULATORY_CARE_PROVIDER_SITE_OTHER): Payer: Medicare Other | Admitting: Family

## 2022-08-12 ENCOUNTER — Other Ambulatory Visit: Payer: Self-pay

## 2022-08-12 ENCOUNTER — Ambulatory Visit: Payer: Medicare Other | Admitting: Family

## 2022-08-12 VITALS — BP 133/61 | HR 69 | Temp 97.7°F | Resp 16 | Wt 155.0 lb

## 2022-08-12 DIAGNOSIS — E1129 Type 2 diabetes mellitus with other diabetic kidney complication: Secondary | ICD-10-CM

## 2022-08-12 DIAGNOSIS — Z23 Encounter for immunization: Secondary | ICD-10-CM | POA: Diagnosis not present

## 2022-08-12 DIAGNOSIS — E785 Hyperlipidemia, unspecified: Secondary | ICD-10-CM | POA: Diagnosis not present

## 2022-08-12 DIAGNOSIS — I10 Essential (primary) hypertension: Secondary | ICD-10-CM

## 2022-08-12 DIAGNOSIS — R809 Proteinuria, unspecified: Secondary | ICD-10-CM

## 2022-08-12 MED ORDER — VALSARTAN 40 MG PO TABS: 20.0000 mg | ORAL_TABLET | Freq: Every day | ORAL | 3 refills | Status: DC

## 2022-08-12 NOTE — Progress Notes (Signed)
Subjective:   By signing my name below, I, [name], attest that this documentation has been prepared under the direction and in the presence of [place], NP 08/12/2022   Patient ID: Chloe Reid, female    DOB: Feb 24, 1956, 66 y.o.   MRN: [id number]  Chief Complaint  Patient presents with   Diabetes    Here for follow up   Patient is in today for an office visit  Urinary Incontinence: She complains of urinary incontinence. Her symptoms appear regularly and occur mostly during the mornings.  Lisinopril: She discontinued use of Lisinopril due to dizziness. She took the medication for a couple of days before discontinuing.  BP Readings from Last 3 Encounters:  08/12/22 133/61  07/09/22 (!) 152/79  06/12/22 139/83   Pulse Readings from Last 3 Encounters:  08/12/22 69  07/09/22 79  06/12/22 (!) 58   Diabetes: Her diabetes test is at goal Lab Results  Component Value Date   HGBA1C 6.9 (H) 08/12/2022   Colonoscopy: She has not received a colonoscopy. She was called for a colonoscopy but wanted to wait until she completes her lumpectomy.  Immunizations: She is interested in receiving an influenza vaccine during today's visit. She has received the first dose of the Shingrix vaccine.   Health Maintenance Due  Topic Date Due   COLONOSCOPY (Pts 45-76yr Insurance coverage will need to be confirmed)  Never done   OPHTHALMOLOGY EXAM  09/09/2015   COVID-19 Vaccine (2 - Moderna risk series) 06/09/2022   Zoster Vaccines- Shingrix (2 of 2) 07/07/2022    Past Medical History:  Diagnosis Date   Breast cancer ([facility]    left breast IDC with DCIS   Cataracts, bilateral    Glaucoma    Hypertension    Kidney stone    Macular drusen    Migraine    Pre-diabetes     Past Surgical History:  Procedure Laterality Date   EYE SURGERY Right    cataract   uterine biopsy      Family History  Problem Relation Age of Onset   Heart disease Father    Kidney disease Father      Social History   Socioeconomic History   Marital status: Married    Spouse name: Not on file   Number of children: Not on file   Years of education: Not on file   Highest education level: Not on file  Occupational History   Not on file  Tobacco Use   Smoking status: Never   Smokeless tobacco: Never  Vaping Use   Vaping Use: Never used  Substance and Sexual Activity   Alcohol use: No   Drug use: Never   Sexual activity: Not Currently    Birth control/protection: Post-menopausal  Other Topics Concern   Not on file  Social History Narrative   Has 4 children- 3 daughter's 1 son, all live at home   Married   Cooks at [employer]  Enjoys television, cooking   No pets   [place]   Social Determinants of Health   Financial Resource Strain: Not on fComcastInsecurity: Not on file  Transportation Needs: Not on file  Physical Activity: Not on file  Stress: Not on file  Social Connections: Not on file  Intimate Partner Violence: Not on file    Outpatient Medications Prior to Visit  Medication Sig Dispense Refill   acetaminophen (TYLENOL) 500 MG tablet Take 1 tablet (  500 mg total) by mouth every 6 (six) hours as needed. 30 tablet 0   calcium-vitamin D (OSCAL WITH D) 500-200 MG-UNIT tablet Take 1 tablet by mouth 3 (three) times daily. 90 tablet 12   Zoster Vaccine Adjuvanted [facility]) injection Inject 0.5 mcg IM now and again in 2-6 months. 0.5 mL 1   No facility-administered medications prior to visit.    Allergies  Allergen Reactions   Lisinopril     Dizziness    Review of Systems  Genitourinary:        (+) Urinary Incontinence       Objective:    Physical Exam Constitutional:      General: She is not in acute distress.    Appearance: Normal appearance. She is not ill-appearing.  HENT:     Head: Normocephalic and atraumatic.     Right Ear: Tympanic membrane, ear canal and external ear normal.     Left Ear: Tympanic  membrane, ear canal and external ear normal.  Eyes:     Extraocular Movements: Extraocular movements intact.     Pupils: Pupils are equal, round, and reactive to light.  Cardiovascular:     Rate and Rhythm: Normal rate and regular rhythm.     Heart sounds: Normal heart sounds. No murmur heard.    No gallop.  Pulmonary:     Effort: Pulmonary effort is normal. No respiratory distress.     Breath sounds: Normal breath sounds. No wheezing or rales.  Abdominal:     General: Bowel sounds are normal. There is no distension.     Palpations: Abdomen is soft.     Tenderness: There is no abdominal tenderness. There is no guarding.  Musculoskeletal:     Comments: 5/5 strength in both upper and lower extremities    Skin:    General: Skin is warm and dry.  Neurological:     Mental Status: She is alert and oriented to person, place, and time.     Deep Tendon Reflexes:     Reflex Scores:      Patellar reflexes are 2+ on the right side and 2+ on the left side. Psychiatric:        Mood and Affect: Mood normal.        Behavior: Behavior normal.        Judgment: Judgment normal.     BP 133/61 (BP Location: Left Arm, Patient Position: Sitting, Cuff Size: Small)   Pulse 69   Temp 97.7 F (36.5 C) (Oral)   Resp 16   Wt 155 lb (70.3 kg)   LMP 12/02/2003   SpO2 99%   BMI 27.46 kg/m  Wt Readings from Last 3 Encounters:  08/12/22 155 lb (70.3 kg)  07/09/22 153 lb 9.6 oz (69.7 kg)  05/12/22 151 lb (68.5 kg)   Diabetic Foot Exam - Simple   Simple Foot Form Diabetic Foot exam was performed with the following findings: Yes 08/12/2022  5:31 PM  Visual Inspection No deformities, no ulcerations, no other skin breakdown bilaterally: Yes Sensation Testing Intact to touch and monofilament testing bilaterally: Yes Pulse Check Posterior Tibialis and Dorsalis pulse intact bilaterally: Yes Comments         Assessment & Plan:   Problem List Items Addressed This Visit       Unprioritized    Microalbuminuria due to type 2 diabetes mellitus ([place])    Did not tolerate ACE. Will give her a trial of low dose valsartan instead.  Relevant Medications   valsartan (DIOVAN) 40 MG tablet   Hyperlipidemia    Lab Results  Component Value Date   CHOL 242 (H) 11/14/2020   HDL 61.50 11/14/2020   LDLCALC 152 (H) 11/14/2020   TRIG 143.0 11/14/2020   CHOLHDL 4 11/14/2020  Last lipid panel above goal. Repeat lipid panel still high. We discussed starting a statin at today's visit if high.  Will initiate atorvastatin. See phone note.       Relevant Medications   valsartan (DIOVAN) 40 MG tablet   Other Relevant Orders   Lipid panel (Completed)   Controlled type 2 diabetes mellitus with microalbuminuria, without long-term current use of insulin (HCC)    Lab Results  Component Value Date   HGBA1C 6.9 (H) 08/12/2022   HGBA1C 6.8 (H) 05/12/2022   HGBA1C 6.8 (H) 11/14/2020   Lab Results  Component Value Date   MICROALBUR 2.1 (H) 05/12/2022   LDLCALC 141 (H) 08/12/2022   CREATININE 0.71 08/12/2022  A1C remains at goal. Continue diabetic diet.       Relevant Medications   valsartan (DIOVAN) 40 MG tablet   Other Relevant Orders   Basic metabolic panel (Completed)   Hemoglobin A1c (Completed)   Other Visit Diagnoses     Microalbuminuria    -  Primary   Relevant Orders   Basic metabolic panel   Needs flu shot       Relevant Orders   Flu Vaccine QUAD High Dose(Fluad) (Completed)      Meds ordered this encounter  Medications   valsartan (DIOVAN) 40 MG tablet    Sig: Take 0.5 tablets (20 mg total) by mouth daily.    Dispense:  90 tablet    Refill:  3    Order Specific Question:   Supervising Provider    Answer:   Penni Homans A [[id number]]    I, Nance Pear, NP, personally preformed the services described in this documentation.  All medical record entries made by the scribe were at my direction and in my presence.  I have reviewed the chart and discharge instructions  (if applicable) and agree that the record reflects my personal performance and is accurate and complete. 08/12/2022   I,Amber Collins,acting as a Education administrator for Nance Pear, NP.,have documented all relevant documentation on the behalf of Nance Pear, NP,as directed by  Nance Pear, NP while in the presence of Nance Pear, NP.    Nance Pear, NP

## 2022-08-12 NOTE — Assessment & Plan Note (Signed)
Lab Results  Component Value Date   CHOL 242 (H) 11/14/2020   HDL 61.50 11/14/2020   LDLCALC 152 (H) 11/14/2020   TRIG 143.0 11/14/2020   CHOLHDL 4 11/14/2020   l

## 2022-08-12 NOTE — Assessment & Plan Note (Signed)
Lab Results  Component Value Date   HGBA1C 6.9 (H) 08/12/2022   HGBA1C 6.8 (H) 05/12/2022   HGBA1C 6.8 (H) 11/14/2020   Lab Results  Component Value Date   MICROALBUR 2.1 (H) 05/12/2022   LDLCALC 141 (H) 08/12/2022   CREATININE 0.71 08/12/2022   A1C remains at goal. Continue diabetic diet.

## 2022-08-12 NOTE — Patient Instructions (Signed)
Please call [facility] to schedule your colonoscopy- [phone]. 

## 2022-08-13 ENCOUNTER — Telehealth: Payer: Self-pay | Admitting: Family

## 2022-08-13 LAB — LIPID PANEL
Cholesterol: 231 mg/dL — ABNORMAL HIGH (ref 0–200)
HDL: 54.9 mg/dL (ref >39.00)
LDL Cholesterol: 141 mg/dL — ABNORMAL HIGH (ref 0–99)
NonHDL: 175.7
Total CHOL/HDL Ratio: 4
Triglycerides: 176 mg/dL — ABNORMAL HIGH (ref 0.0–149.0)
VLDL: 35.2 mg/dL (ref 0.0–40.0)

## 2022-08-13 LAB — BASIC METABOLIC PANEL
BUN: 18 mg/dL (ref 6–23)
CO2: 26 mEq/L (ref 19–32)
Calcium: 9.4 mg/dL (ref 8.4–10.5)
Chloride: 102 mEq/L (ref 96–112)
Creatinine, Ser: 0.71 mg/dL (ref 0.40–1.20)
GFR: 87.45 mL/min (ref >60.00)
Glucose, Bld: 125 mg/dL — ABNORMAL HIGH (ref 70–99)
Potassium: 4.1 mEq/L (ref 3.5–5.1)
Sodium: 137 mEq/L (ref 135–145)

## 2022-08-13 LAB — HEMOGLOBIN A1C: Hgb A1c MFr Bld: 6.9 % — ABNORMAL HIGH (ref 4.6–6.5)

## 2022-08-13 MED ORDER — ATORVASTATIN CALCIUM 20 MG PO TABS: 20.0000 mg | ORAL_TABLET | Freq: Every day | ORAL | 1 refills | Status: DC

## 2022-08-13 NOTE — Telephone Encounter (Signed)
Information given to patient's daughter

## 2022-08-13 NOTE — Assessment & Plan Note (Signed)
Did not tolerate ACE. Will give her a trial of low dose valsartan instead.

## 2022-08-13 NOTE — Telephone Encounter (Signed)
Sugar remains at goal. Cholesterol is elevated. Please start atorvastatin '20mg'$  once daily.

## 2022-08-19 ENCOUNTER — Ambulatory Visit: Payer: Medicare Other | Admitting: Hematology and Oncology

## 2022-08-19 ENCOUNTER — Ambulatory Visit
Admission: RE | Admit: 2022-08-19 | Discharge: 2022-08-19 | Disposition: A | Discharge status: Home / Self Care | Payer: Medicare Other | Source: Ambulatory Visit | Attending: General Surgery | Admitting: General Surgery

## 2022-08-19 ENCOUNTER — Encounter (HOSPITAL_BASED_OUTPATIENT_CLINIC_OR_DEPARTMENT_OTHER): Payer: Self-pay | Admitting: General Surgery

## 2022-08-19 DIAGNOSIS — C50412 Malignant neoplasm of upper-outer quadrant of left female breast: Secondary | ICD-10-CM

## 2022-08-19 NOTE — H&P (Signed)
REFERRING PHYSICIAN: Ammie Ferrier, MD  PROVIDER: Georgianne Fick, MD  Care Team: Patient Care Team: Nance Pear, NP as PCP - General (Family Medicine) Barry Dienes Ballard Russell, MD as Consulting Provider (Surgical Oncology) Rulon Eisenmenger, MD (Hematology and Oncology) Blair Promise, MD (Radiation Oncology) Jamie Kato, MD as Consulting Provider (Radiology)   MRN: [id number] DOB: 07/22/1956   Subjective   Chief Complaint: Breast Cancer  History of Present Illness: Chloe Reid is a 66 y.o. female who is seen today as an office consultation at the request of Dr. Lindi Adie for evaluation of Breast Cancer  Pt presents with a new diagnosis of left breast cancer 07/2022. She had a screening detected left breast mass. Diagnostic imaging was performed and demonstrated a 1 cm mass at 2 o'clock. Core needle biopsy was performed. 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  Family cancer history - none known Menarche- 35 Menopause age 34 Parity G18P4, first at age 46  Work- housewife.   Diagnostic mammogram/us 06/23/2022 [facility] ACR Breast Density Category b: There are scattered areas of fibroglandular density.  FINDINGS: The mass in the upper outer left breast persists on today's imaging.  Targeted ultrasound is performed, showing an irregular hypoechoic mass with posterior shadowing at 2 o'clock, 5 cm from the nipple measuring 10 by 7 x 9 mm. No axillary adenopathy.  IMPRESSION: Highly suspicious left breast mass. No adenopathy identified.  RECOMMENDATION: Recommend ultrasound-guided biopsy of the highly suspicious left breast mass at 2 o'clock.  I have discussed the findings and recommendations with the patient. If applicable, a reminder letter will be sent to the patient regarding the next appointment.  BI-RADS CATEGORY 5: Highly suggestive of malignancy.  Pathology core needle biopsy: 07/01/2022 Breast, left, needle core biopsy,  2:00, 5cmfn (ribbon clip) - INVASIVE DUCTAL CARCINOMA, SEE NOTE - DUCTAL CARCINOMA IN SITU, INTERMEDIATE GRADE - TUBULE FORMATION: SCORE 3 - NUCLEAR PLEOMORPHISM: SCORE 2 - MITOTIC COUNT: SCORE 1 - TOTAL SCORE: 6 - OVERALL GRADE: 2 - LYMPHOVASCULAR INVASION: NOT IDENTIFIED - CANCER LENGTH: 1.2 CM - CALCIFICATIONS: NOT IDENTIFIED - OTHER FINDINGS: NONE  Receptors: The tumor cells are NEGATIVE for Her2 (1+). Estrogen Receptor: 95%, POSITIVE, STRONG STAINING INTENSITY Progesterone Receptor: 20%, POSITIVE, STRONG STAINING INTENSITY Proliferation Marker Ki67: 5%  Review of Systems: A complete review of systems was obtained from the patient. I have reviewed this information and discussed as appropriate with the patient. See HPI as well for other ROS. ROS positive for  Chills/night sweats, fatigue, blurred vision, shortness of breath with stairs, incontinence, weakness, forgetfulness, anxiety, depression  Medical History: Past Medical History:  Diagnosis Date  Arthritis  Diabetes mellitus without complication (CMS-HCC)  History of cancer  Hyperlipidemia   Patient Active Problem List  Diagnosis  Malignant neoplasm of upper-outer quadrant of left breast in female, estrogen receptor positive (CMS-HCC)  Hyperlipidemia  Controlled type 2 diabetes mellitus with microalbuminuria, without long-term current use of insulin (CMS-HCC)  Cataracts, bilateral  Hearing loss  Visual disturbance  Weakness  Migraine   History reviewed. No pertinent surgical history.   No Known Allergies  Current Outpatient Medications on File Prior to Visit  Medication Sig Dispense Refill  acetaminophen (TYLENOL) 325 MG tablet Take 325 mg by mouth every 6 (six) hours as needed  calcium carbonate-vitamin D3 (CALCIUM-VITAMIN D) 500 mg-10 mcg (400 unit) tablet Take 1 tablet by mouth 3 (three) times daily  lisinopriL (ZESTRIL) 2.5 MG tablet Take 2.5 mg by mouth once daily  multivitamin tablet Take 1 tablet by  mouth once daily   No current facility-administered medications on file prior to visit.   Family History  Problem Relation Age of Onset  Diabetes Mother  Coronary Artery Disease (Blocked arteries around heart) Mother  Hyperlipidemia (Elevated cholesterol) Mother  Coronary Artery Disease (Blocked arteries around heart) Father  Hyperlipidemia (Elevated cholesterol) Father  Hyperlipidemia (Elevated cholesterol) Sister  Coronary Artery Disease (Blocked arteries around heart) Brother  Hyperlipidemia (Elevated cholesterol) Brother    Social History   Tobacco Use  Smoking Status Never  Smokeless Tobacco Never    Social History   Socioeconomic History  Marital status: Single  Tobacco Use  Smoking status: Never  Smokeless tobacco: Never  Vaping Use  Vaping Use: Never used  Substance and Sexual Activity  Alcohol use: Never  Drug use: Never  Sexual activity: Defer   Objective:   Vitals:  BP: (!) 152/79  Pulse: 79  Resp: 18  Temp: 36.6 C (97.9 F)  Weight: 69.7 kg (153 lb 9.6 oz)  Height: 160 cm (5' 3" )   Body mass index is 27.21 kg/m.  Gen: No acute distress. Well nourished and well groomed.  Neurological: Alert and oriented to person, place, and time. Coordination normal.  Head: Normocephalic and atraumatic.  Eyes: Conjunctivae are normal. Pupils are equal, round, and reactive to light. No scleral icterus.  Neck: Normal range of motion. Neck supple. No tracheal deviation or thyromegaly present.  Cardiovascular: Normal rate, regular rhythm, normal heart sounds and intact distal pulses. Exam reveals no gallop and no friction rub. No murmur heard. Breast: breasts relatively symmetric, ptotic. No palpable masses. No skin dimpling. No nipple retraction or nipple discharge. No LAD. Right breast also benign.  Respiratory: Effort normal. No respiratory distress. No chest wall tenderness. Breath sounds normal. No wheezes, rales or rhonchi.  GI: Soft. Bowel sounds are normal.  The abdomen is soft and nontender. There is no rebound and no guarding.  Musculoskeletal: Normal range of motion. Extremities are nontender.  Lymphadenopathy: No cervical, preauricular, postauricular or axillary adenopathy is present Skin: Skin is warm and dry. No rash noted. No diaphoresis. No erythema. No pallor. No clubbing, cyanosis, or edema.  Psychiatric: Normal mood and affect. Behavior is normal. Judgment and thought content normal.   Labs CBC, CMET essentially normal 07/09/2022    Assessment and Plan:   ICD-10-CM  1. Malignant neoplasm of upper-outer quadrant of left breast in female, estrogen receptor positive (CMS-HCC) C50.412  Z17.0    Pt has a new diagnosis of left breast cancer, cT1cN0. I recommend breast conservation followed by oncotype with +/- chemo, radiation, and antihormonal tx.  I will set up a seed localized lumpectomy with sentinel node biopsy.  The surgical procedure was described to the patient. I discussed the incision type and location and that we would need radiology involved on with a wire or seed marker and/or sentinel node.   The risks and benefits of the procedure were described to the patient and she wishes to proceed.   We discussed the risks bleeding, infection, damage to other structures, need for further procedures/surgeries. We discussed the risk of seroma. The patient was advised if the area in the breast in cancer, we may need to go back to surgery for additional tissue to obtain negative margins or for a lymph node biopsy. The patient was advised that these are the most common complications, but that others can occur as well. They were advised against taking aspirin or other anti-inflammatory agents/blood  thinners the week before surgery.

## 2022-08-19 NOTE — Progress Notes (Signed)

## 2022-08-19 NOTE — Anesthesia Preprocedure Evaluation (Signed)
Anesthesia Evaluation  Patient identified by MRN, date of birth, ID band Patient awake    Reviewed: Allergy & Precautions, NPO status , Patient's Chart, lab work & pertinent test results, reviewed documented beta blocker date and time   Airway Mallampati: II  TM Distance: >3 FB Neck ROM: Full    Dental  (+) Caps, Missing, Dental Advisory Given   Pulmonary neg pulmonary ROS,    Pulmonary exam normal breath sounds clear to auscultation       Cardiovascular hypertension, Pt. on medications Normal cardiovascular exam Rhythm:Regular Rate:Normal     Neuro/Psych  Headaches, negative psych ROS   GI/Hepatic negative GI ROS, Neg liver ROS,   Endo/Other  diabetes, Well ControlledPre diabetes Left breast Ca  Hyperlipidemia   Renal/GU Renal diseaseHx/o nephrolithiasis microalbumiuria  negative genitourinary   Musculoskeletal negative musculoskeletal ROS (+)   Abdominal   Peds  Hematology negative hematology ROS (+)   Anesthesia Other Findings   Reproductive/Obstetrics                           Anesthesia Physical Anesthesia Plan  ASA: 2  Anesthesia Plan: General   Post-op Pain Management: Regional block*, Minimal or no pain anticipated, Precedex and Tylenol PO (pre-op)*   Induction: Intravenous  PONV Risk Score and Plan: 4 or greater and Treatment may vary due to age or medical condition, Dexamethasone and Ondansetron  Airway Management Planned: LMA  Additional Equipment: None  Intra-op Plan:   Post-operative Plan: Extubation in OR  Informed Consent: I have reviewed the patients History and Physical, chart, labs and discussed the procedure including the risks, benefits and alternatives for the proposed anesthesia with the patient or authorized representative who has indicated his/her understanding and acceptance.     Dental advisory given  Plan Discussed with: CRNA and  Anesthesiologist  Anesthesia Plan Comments:        Anesthesia Quick Evaluation

## 2022-08-20 ENCOUNTER — Encounter (HOSPITAL_BASED_OUTPATIENT_CLINIC_OR_DEPARTMENT_OTHER): Payer: Self-pay | Admitting: General Surgery

## 2022-08-20 ENCOUNTER — Ambulatory Visit (HOSPITAL_BASED_OUTPATIENT_CLINIC_OR_DEPARTMENT_OTHER)
Admission: RE | Admit: 2022-08-20 | Discharge: 2022-08-20 | Disposition: A | Discharge status: Home / Self Care | Payer: Medicare Other | Source: Ambulatory Visit | Attending: General Surgery | Admitting: General Surgery

## 2022-08-20 ENCOUNTER — Other Ambulatory Visit: Payer: Self-pay

## 2022-08-20 ENCOUNTER — Ambulatory Visit (HOSPITAL_BASED_OUTPATIENT_CLINIC_OR_DEPARTMENT_OTHER): Payer: Medicare Other | Admitting: Anesthesiology

## 2022-08-20 ENCOUNTER — Encounter (HOSPITAL_BASED_OUTPATIENT_CLINIC_OR_DEPARTMENT_OTHER)
Admission: RE | Disposition: A | Discharge status: Home / Self Care | Payer: Self-pay | Source: Ambulatory Visit | Attending: General Surgery

## 2022-08-20 ENCOUNTER — Ambulatory Visit
Admission: RE | Admit: 2022-08-20 | Discharge: 2022-08-20 | Disposition: A | Discharge status: Home / Self Care | Payer: Medicare Other | Source: Ambulatory Visit | Attending: General Surgery | Admitting: General Surgery

## 2022-08-20 DIAGNOSIS — E119 Type 2 diabetes mellitus without complications: Secondary | ICD-10-CM | POA: Diagnosis not present

## 2022-08-20 DIAGNOSIS — C50912 Malignant neoplasm of unspecified site of left female breast: Secondary | ICD-10-CM | POA: Diagnosis not present

## 2022-08-20 DIAGNOSIS — C50412 Malignant neoplasm of upper-outer quadrant of left female breast: Secondary | ICD-10-CM | POA: Insufficient documentation

## 2022-08-20 DIAGNOSIS — N289 Disorder of kidney and ureter, unspecified: Secondary | ICD-10-CM | POA: Diagnosis not present

## 2022-08-20 DIAGNOSIS — Z8249 Family history of ischemic heart disease and other diseases of the circulatory system: Secondary | ICD-10-CM | POA: Insufficient documentation

## 2022-08-20 DIAGNOSIS — I10 Essential (primary) hypertension: Secondary | ICD-10-CM | POA: Diagnosis not present

## 2022-08-20 DIAGNOSIS — Z170 Estrogen receptor positive status [ER+]: Secondary | ICD-10-CM | POA: Insufficient documentation

## 2022-08-20 DIAGNOSIS — Z79899 Other long term (current) drug therapy: Secondary | ICD-10-CM | POA: Diagnosis not present

## 2022-08-20 DIAGNOSIS — Z833 Family history of diabetes mellitus: Secondary | ICD-10-CM | POA: Diagnosis not present

## 2022-08-20 DIAGNOSIS — R7303 Prediabetes: Secondary | ICD-10-CM

## 2022-08-20 HISTORY — PX: BREAST LUMPECTOMY WITH RADIOACTIVE SEED AND SENTINEL LYMPH NODE BIOPSY: SHX6550

## 2022-08-20 HISTORY — DX: Prediabetes: R73.03

## 2022-08-20 HISTORY — DX: Essential (primary) hypertension: I10

## 2022-08-20 SURGERY — BREAST LUMPECTOMY WITH RADIOACTIVE SEED AND SENTINEL LYMPH NODE BIOPSY
Anesthesia: General | Site: Breast | Laterality: Left

## 2022-08-20 MED ORDER — SCOPOLAMINE 1 MG/3DAYS TD PT72
MEDICATED_PATCH | TRANSDERMAL | Status: AC
  Filled 2022-08-20: qty 1

## 2022-08-20 MED ORDER — MIDAZOLAM HCL 2 MG/2ML IJ SOLN
2.0000 mg | Freq: Once | INTRAMUSCULAR | Status: AC
  Administered 2022-08-20: 1 mg via INTRAVENOUS

## 2022-08-20 MED ORDER — ACETAMINOPHEN 500 MG PO TABS
ORAL_TABLET | ORAL | Status: AC
  Filled 2022-08-20: qty 2

## 2022-08-20 MED ORDER — BUPIVACAINE HCL (PF) 0.5 % IJ SOLN
INTRAMUSCULAR | Status: DC | PRN
  Administered 2022-08-20: 20 mL via PERINEURAL

## 2022-08-20 MED ORDER — ACETAMINOPHEN 500 MG PO TABS: 1000.0000 mg | ORAL_TABLET | Freq: Once | ORAL | Status: DC

## 2022-08-20 MED ORDER — LIDOCAINE 2% (20 MG/ML) 5 ML SYRINGE
INTRAMUSCULAR | Status: AC
  Filled 2022-08-20: qty 5

## 2022-08-20 MED ORDER — CEFAZOLIN SODIUM-DEXTROSE 2-4 GM/100ML-% IV SOLN
2.0000 g | INTRAVENOUS | Status: AC
  Administered 2022-08-20: 2 g via INTRAVENOUS

## 2022-08-20 MED ORDER — BUPIVACAINE LIPOSOME 1.3 % IJ SUSP
INTRAMUSCULAR | Status: DC | PRN
  Administered 2022-08-20: 10 mL via PERINEURAL

## 2022-08-20 MED ORDER — LIDOCAINE-EPINEPHRINE (PF) 1 %-1:200000 IJ SOLN
INTRAMUSCULAR | Status: DC | PRN
  Administered 2022-08-20: 10 mL

## 2022-08-20 MED ORDER — OXYCODONE HCL 5 MG/5ML PO SOLN: 5.0000 mg | Freq: Once | ORAL | Status: DC | PRN

## 2022-08-20 MED ORDER — ONDANSETRON HCL 4 MG/2ML IJ SOLN
4.0000 mg | Freq: Once | INTRAMUSCULAR | Status: DC | PRN
Start: 2022-08-20 — End: 2022-08-20

## 2022-08-20 MED ORDER — PHENYLEPHRINE 80 MCG/ML (10ML) SYRINGE FOR IV PUSH (FOR BLOOD PRESSURE SUPPORT)
PREFILLED_SYRINGE | INTRAVENOUS | Status: AC
  Filled 2022-08-20: qty 10

## 2022-08-20 MED ORDER — SUCCINYLCHOLINE CHLORIDE 200 MG/10ML IV SOSY
PREFILLED_SYRINGE | INTRAVENOUS | Status: AC
  Filled 2022-08-20: qty 10

## 2022-08-20 MED ORDER — MIDAZOLAM HCL 2 MG/2ML IJ SOLN
INTRAMUSCULAR | Status: AC
  Filled 2022-08-20: qty 2

## 2022-08-20 MED ORDER — PHENYLEPHRINE HCL-NACL 20-0.9 MG/250ML-% IV SOLN
INTRAVENOUS | Status: DC | PRN
  Administered 2022-08-20 (×2): 40 ug/min via INTRAVENOUS

## 2022-08-20 MED ORDER — ATROPINE SULFATE 0.4 MG/ML IV SOLN
INTRAVENOUS | Status: AC
  Filled 2022-08-20: qty 1

## 2022-08-20 MED ORDER — GLYCOPYRROLATE 0.2 MG/ML IJ SOLN
INTRAMUSCULAR | Status: DC | PRN
  Administered 2022-08-20: .2 mg via INTRAVENOUS

## 2022-08-20 MED ORDER — PHENYLEPHRINE HCL (PRESSORS) 10 MG/ML IV SOLN
INTRAVENOUS | Status: DC | PRN
  Administered 2022-08-20 (×2): 40 ug via INTRAVENOUS

## 2022-08-20 MED ORDER — PHENYLEPHRINE HCL (PRESSORS) 10 MG/ML IV SOLN
INTRAVENOUS | Status: AC
  Filled 2022-08-20: qty 1

## 2022-08-20 MED ORDER — LIDOCAINE HCL (CARDIAC) PF 100 MG/5ML IV SOSY
PREFILLED_SYRINGE | INTRAVENOUS | Status: DC | PRN
  Administered 2022-08-20: 30 mg via INTRAVENOUS

## 2022-08-20 MED ORDER — FENTANYL CITRATE (PF) 100 MCG/2ML IJ SOLN
100.0000 ug | Freq: Once | INTRAMUSCULAR | Status: AC
  Administered 2022-08-20: 50 ug via INTRAVENOUS

## 2022-08-20 MED ORDER — OXYCODONE HCL 5 MG PO TABS: 5.0000 mg | ORAL_TABLET | Freq: Once | ORAL | Status: DC | PRN

## 2022-08-20 MED ORDER — GLYCOPYRROLATE PF 0.2 MG/ML IJ SOSY
PREFILLED_SYRINGE | INTRAMUSCULAR | Status: AC
  Filled 2022-08-20: qty 1

## 2022-08-20 MED ORDER — FENTANYL CITRATE (PF) 100 MCG/2ML IJ SOLN
25.0000 ug | INTRAMUSCULAR | Status: DC | PRN
  Administered 2022-08-20: 50 ug via INTRAVENOUS

## 2022-08-20 MED ORDER — CEFAZOLIN SODIUM-DEXTROSE 2-4 GM/100ML-% IV SOLN
INTRAVENOUS | Status: AC
  Filled 2022-08-20: qty 100

## 2022-08-20 MED ORDER — FENTANYL CITRATE (PF) 100 MCG/2ML IJ SOLN
INTRAMUSCULAR | Status: DC | PRN
  Administered 2022-08-20: 50 ug via INTRAVENOUS

## 2022-08-20 MED ORDER — OXYCODONE HCL 5 MG PO TABS: 5.0000 mg | ORAL_TABLET | Freq: Four times a day (QID) | ORAL | 0 refills | Status: DC | PRN

## 2022-08-20 MED ORDER — CHLORHEXIDINE GLUCONATE CLOTH 2 % EX PADS: 6.0000 | MEDICATED_PAD | Freq: Once | CUTANEOUS | Status: DC

## 2022-08-20 MED ORDER — DEXAMETHASONE SODIUM PHOSPHATE 4 MG/ML IJ SOLN
INTRAMUSCULAR | Status: DC | PRN
  Administered 2022-08-20: 4 mg via INTRAVENOUS

## 2022-08-20 MED ORDER — DEXAMETHASONE SODIUM PHOSPHATE 10 MG/ML IJ SOLN
INTRAMUSCULAR | Status: AC
  Filled 2022-08-20: qty 1

## 2022-08-20 MED ORDER — 0.9 % SODIUM CHLORIDE (POUR BTL) OPTIME
TOPICAL | Status: DC | PRN
  Administered 2022-08-20: 200 mL

## 2022-08-20 MED ORDER — FENTANYL CITRATE (PF) 100 MCG/2ML IJ SOLN
INTRAMUSCULAR | Status: AC
  Filled 2022-08-20: qty 2

## 2022-08-20 MED ORDER — LACTATED RINGERS IV SOLN: INTRAVENOUS | Status: DC

## 2022-08-20 MED ORDER — PROPOFOL 10 MG/ML IV BOLUS
INTRAVENOUS | Status: DC | PRN
  Administered 2022-08-20: 150 mg via INTRAVENOUS

## 2022-08-20 MED ORDER — ACETAMINOPHEN 500 MG PO TABS
1000.0000 mg | ORAL_TABLET | ORAL | Status: AC
  Administered 2022-08-20: 1000 mg via ORAL

## 2022-08-20 MED ORDER — MAGTRACE LYMPHATIC TRACER
INTRAMUSCULAR | Status: DC | PRN
  Administered 2022-08-20: 2 mL via INTRAMUSCULAR

## 2022-08-20 MED ORDER — ONDANSETRON HCL 4 MG/2ML IJ SOLN
INTRAMUSCULAR | Status: AC
  Filled 2022-08-20: qty 2

## 2022-08-20 MED ORDER — EPHEDRINE 5 MG/ML INJ
INTRAVENOUS | Status: AC
  Filled 2022-08-20: qty 5

## 2022-08-20 SURGICAL SUPPLY — 62 items
BINDER BREAST LRG (GAUZE/BANDAGES/DRESSINGS) IMPLANT
BINDER BREAST MEDIUM (GAUZE/BANDAGES/DRESSINGS) IMPLANT
BINDER BREAST XLRG (GAUZE/BANDAGES/DRESSINGS) IMPLANT
BINDER BREAST XXLRG (GAUZE/BANDAGES/DRESSINGS) IMPLANT
BLADE SURG 10 STRL SS (BLADE) ×1 IMPLANT
BLADE SURG 15 STRL LF DISP TIS (BLADE) ×1 IMPLANT
BLADE SURG 15 STRL SS (BLADE) ×1
BNDG COHESIVE 4X5 TAN STRL LF (GAUZE/BANDAGES/DRESSINGS) ×1 IMPLANT
CANISTER SUC SOCK COL 7IN (MISCELLANEOUS) IMPLANT
CANISTER SUCT 1200ML W/VALVE (MISCELLANEOUS) ×1 IMPLANT
CHLORAPREP W/TINT 26 (MISCELLANEOUS) ×1 IMPLANT
CLIP TI LARGE 6 (CLIP) ×1 IMPLANT
CLIP TI MEDIUM 6 (CLIP) ×2 IMPLANT
CLIP TI WIDE RED SMALL 6 (CLIP) IMPLANT
COVER MAYO STAND STRL (DRAPES) ×2 IMPLANT
COVER PROBE W GEL 5X96 (DRAPES) ×1 IMPLANT
DERMABOND ADVANCED .7 DNX12 (GAUZE/BANDAGES/DRESSINGS) ×1 IMPLANT
DRAPE UTILITY XL STRL (DRAPES) ×1 IMPLANT
ELECT COATED BLADE 2.86 ST (ELECTRODE) ×1 IMPLANT
ELECT REM PT RETURN 9FT ADLT (ELECTROSURGICAL) ×1
ELECTRODE REM PT RTRN 9FT ADLT (ELECTROSURGICAL) ×1 IMPLANT
GAUZE PAD ABD 8X10 STRL (GAUZE/BANDAGES/DRESSINGS) ×1 IMPLANT
GAUZE SPONGE 4X4 12PLY STRL LF (GAUZE/BANDAGES/DRESSINGS) ×1 IMPLANT
GLOVE BIO SURGEON STRL SZ 6 (GLOVE) ×1 IMPLANT
GLOVE BIOGEL PI IND STRL 6.5 (GLOVE) ×1 IMPLANT
GLOVE BIOGEL PI IND STRL 7.0 (GLOVE) IMPLANT
GLOVE ECLIPSE 6.5 STRL STRAW (GLOVE) IMPLANT
GOWN STRL REUS W/ TWL LRG LVL3 (GOWN DISPOSABLE) ×1 IMPLANT
GOWN STRL REUS W/TWL 2XL LVL3 (GOWN DISPOSABLE) ×1 IMPLANT
GOWN STRL REUS W/TWL LRG LVL3 (GOWN DISPOSABLE) ×2
KIT MARKER MARGIN INK (KITS) ×1 IMPLANT
LIGHT WAVEGUIDE WIDE FLAT (MISCELLANEOUS) IMPLANT
NDL HYPO 25X1 1.5 SAFETY (NEEDLE) ×1 IMPLANT
NDL SAFETY ECLIP 18X1.5 (MISCELLANEOUS) ×1 IMPLANT
NEEDLE HYPO 25X1 1.5 SAFETY (NEEDLE) ×1 IMPLANT
NS IRRIG 1000ML POUR BTL (IV SOLUTION) ×1 IMPLANT
PACK BASIN DAY SURGERY FS (CUSTOM PROCEDURE TRAY) ×1 IMPLANT
PACK UNIVERSAL I (CUSTOM PROCEDURE TRAY) ×1 IMPLANT
PENCIL SMOKE EVACUATOR (MISCELLANEOUS) ×1 IMPLANT
RETRACTOR ONETRAX LX 90X20 (MISCELLANEOUS) IMPLANT
SLEEVE SCD COMPRESS KNEE MED (STOCKING) ×1 IMPLANT
SPIKE FLUID TRANSFER (MISCELLANEOUS) IMPLANT
SPONGE T-LAP 18X18 ~~LOC~~+RFID (SPONGE) ×2 IMPLANT
STAPLER VISISTAT 35W (STAPLE) IMPLANT
STOCKINETTE IMPERVIOUS LG (DRAPES) ×1 IMPLANT
STRIP CLOSURE SKIN 1/2X4 (GAUZE/BANDAGES/DRESSINGS) ×1 IMPLANT
SUT ETHILON 2 0 FS 18 (SUTURE) IMPLANT
SUT MNCRL AB 4-0 PS2 18 (SUTURE) ×1 IMPLANT
SUT MON AB 5-0 PS2 18 (SUTURE) IMPLANT
SUT SILK 2 0 SH (SUTURE) IMPLANT
SUT VIC AB 2-0 SH 27 (SUTURE) ×1
SUT VIC AB 2-0 SH 27XBRD (SUTURE) ×1 IMPLANT
SUT VIC AB 3-0 SH 27 (SUTURE)
SUT VIC AB 3-0 SH 27X BRD (SUTURE) ×1 IMPLANT
SUT VICRYL 3-0 CR8 SH (SUTURE) ×1 IMPLANT
SYR BULB EAR ULCER 3OZ GRN STR (SYRINGE) ×1 IMPLANT
SYR CONTROL 10ML LL (SYRINGE) ×1 IMPLANT
TOWEL GREEN STERILE FF (TOWEL DISPOSABLE) ×1 IMPLANT
TRACER MAGTRACE VIAL (MISCELLANEOUS) IMPLANT
TRAY FAXITRON CT DISP (TRAY / TRAY PROCEDURE) ×1 IMPLANT
TUBE CONNECTING 20X1/4 (TUBING) ×1 IMPLANT
YANKAUER SUCT BULB TIP NO VENT (SUCTIONS) ×1 IMPLANT

## 2022-08-20 NOTE — Addendum Note (Signed)
Addendum  created 08/20/22 1308 by Willa Frater, CRNA   Charge Capture section accepted

## 2022-08-20 NOTE — Anesthesia Procedure Notes (Signed)
Anesthesia Regional Block: Pectoralis block   Pre-Anesthetic Checklist: , timeout performed,  Correct Patient, Correct Site, Correct Laterality,  Correct Procedure, Correct Position, site marked,  Risks and benefits discussed,  Surgical consent,  Pre-op evaluation,  At surgeon's request and post-op pain management  Laterality: Left  Prep: chloraprep       Needles:  Injection technique: Single-shot  Needle Type: Echogenic Stimulator Needle     Needle Length: 10cm  Needle Gauge: 21     Additional Needles:   Narrative:  Start time: 08/20/2022 7:58 AM End time: 08/20/2022 8:03 AM Injection made incrementally with aspirations every 5 mL.  Performed by: Personally  Anesthesiologist: Josephine Igo, MD  Additional Notes: Timeout performed. Patient sedated. Relevant anatomy ID'd using [place]. Incremental 2-21m injection of LA with frequent aspiration. Patient tolerated procedure well.     Left Pectoralis Block

## 2022-08-20 NOTE — Interval H&P Note (Signed)
History and Physical Interval Note:  08/20/2022 9:06 AM  Chloe Reid  has presented today for surgery, with the diagnosis of LEFT BREAST CANCER.  The various methods of treatment have been discussed with the patient and family. After consideration of risks, benefits and other options for treatment, the patient has consented to  Procedure(s): LEFT BREAST LUMPECTOMY WITH RADIOACTIVE SEED AND SENTINEL LYMPH NODE BIOPSY (Left) as a surgical intervention.  The patient's history has been reviewed, patient examined, no change in status, stable for surgery.  I have reviewed the patient's chart and labs.  Questions were answered to the patient's satisfaction.     Stark Klein

## 2022-08-20 NOTE — Progress Notes (Signed)
Assisted Dr. Foster with left, pectoralis, ultrasound guided block. Side rails up, monitors on throughout procedure. See vital signs in flow sheet. Tolerated Procedure well. 

## 2022-08-20 NOTE — Discharge Instructions (Addendum)
[place] Office Phone Number [phone]  BREAST BIOPSY/ PARTIAL MASTECTOMY: POST OP INSTRUCTIONS  Always review your discharge instruction sheet given to you by the facility where your surgery was performed.  IF YOU HAVE DISABILITY OR FAMILY LEAVE FORMS, YOU MUST BRING THEM TO THE OFFICE FOR PROCESSING.  DO NOT GIVE THEM TO YOUR DOCTOR.  Take 2 tylenol (acetominophen) three times a day for 3 days.  If you still have pain, add ibuprofen with food in between if able to take this (if you have kidney issues or stomach issues, do not take ibuprofen).  If both of those are not enough, add the narcotic pain pill.  If you find you are needing a lot of this overnight after surgery, call the next morning for a refill.    Prescriptions will not be filled after 5pm or on week-ends. Take your usually prescribed medications unless otherwise directed You should eat very light the first 24 hours after surgery, such as soup, crackers, pudding, etc.  Resume your normal diet the day after surgery. Most patients will experience some swelling and bruising in the breast.  Ice packs and a good support bra will help.  Swelling and bruising can take several days to resolve.  It is common to experience some constipation if taking pain medication after surgery.  Increasing fluid intake and taking a stool softener will usually help or prevent this problem from occurring.  A mild laxative (Milk of Magnesia or Miralax) should be taken according to package directions if there are no bowel movements after 48 hours. Unless discharge instructions indicate otherwise, you may remove your bandages 48 hours after surgery, and you may shower at that time.  You may have steri-strips (small skin tapes) in place directly over the incision.  These strips should be left on the skin at least for for 7-10 days.    ACTIVITIES:  You may resume regular daily activities (gradually increasing) beginning the next day.  Wearing a  good support bra or sports bra (or the breast binder) minimizes pain and swelling.  You may have sexual intercourse when it is comfortable. No heavy lifting for 1-2 weeks (not over around 10 pounds).  You may drive when you no longer are taking prescription pain medication, you can comfortably wear a seatbelt, and you can safely maneuver your car and apply brakes. RETURN TO WORK:  __________3-14 days depending on job. _______________ Dennis Bast should see your doctor in the office for a follow-up appointment approximately two weeks after your surgery.  Your doctor's nurse will typically make your follow-up appointment when she calls you with your pathology report.  Expect your pathology report 3-4 business days after your surgery.  You may call to check if you do not hear from [place] after three days.   WHEN TO CALL YOUR DOCTOR: Fever over 101.0 Nausea and/or vomiting. Extreme swelling or bruising. Continued bleeding from incision. Increased pain, redness, or drainage from the incision.  The clinic staff is available to answer your questions during regular business hours.  Please don't hesitate to call and ask to speak to one of the nurses for clinical concerns.  If you have a medical emergency, go to the nearest emergency room or call 911.  A surgeon from [facility] is always on call at the hospital.  For further questions, please visit [email or web address]    May have Tylenol after 2PM today. May have Ibuprofen as needed, starting at any time     [place]  Care Instructions  Activity: Get plenty of rest for the remainder of the day. A responsible individual must stay with you for 24 hours following the procedure.  For the next 24 hours, DO NOT: -Drive a car -Paediatric nurse -Drink alcoholic beverages -Take any medication unless instructed by your physician -Make any legal decisions or sign important papers.  Meals: Start with liquid foods such as gelatin  or soup. Progress to regular foods as tolerated. Avoid greasy, spicy, heavy foods. If nausea and/or vomiting occur, drink only clear liquids until the nausea and/or vomiting subsides. Call your physician if vomiting continues.  Special Instructions/Symptoms: Your throat may feel dry or sore from the anesthesia or the breathing tube placed in your throat during surgery. If this causes discomfort, gargle with warm salt water. The discomfort should disappear within 24 hours.    Information for Discharge Teaching: EXPAREL (bupivacaine liposome injectable suspension)   Your surgeon or anesthesiologist gave you EXPAREL(bupivacaine) to help control your pain after surgery.  EXPAREL is a local anesthetic that provides pain relief by numbing the tissue around the surgical site. EXPAREL is designed to release pain medication over time and can control pain for up to 72 hours. Depending on how you respond to EXPAREL, you may require less pain medication during your recovery.  Possible side effects: Temporary loss of sensation or ability to move in the area where bupivacaine was injected. Nausea, vomiting, constipation Rarely, numbness and tingling in your mouth or lips, lightheadedness, or anxiety may occur. Call your doctor right away if you think you may be experiencing any of these sensations, or if you have other questions regarding possible side effects.  Follow all other discharge instructions given to you by your surgeon or nurse. Eat a healthy diet and drink plenty of water or other fluids.  If you return to the hospital for any reason within 96 hours following the administration of EXPAREL, it is important for health care providers to know that you have received this anesthetic. A teal colored band has been placed on your arm with the date, time and amount of EXPAREL you have received in order to alert and inform your health care providers. Please leave this armband in place for the full 96 hours  following administration, and then you may remove the band. Information for Discharge Teaching: EXPAREL (bupivacaine liposome injectable suspension)   Your surgeon or anesthesiologist gave you EXPAREL(bupivacaine) to help control your pain after surgery.  EXPAREL is a local anesthetic that provides pain relief by numbing the tissue around the surgical site. EXPAREL is designed to release pain medication over time and can control pain for up to 72 hours. Depending on how you respond to EXPAREL, you may require less pain medication during your recovery.  Possible side effects: Temporary loss of sensation or ability to move in the area where bupivacaine was injected. Nausea, vomiting, constipation Rarely, numbness and tingling in your mouth or lips, lightheadedness, or anxiety may occur. Call your doctor right away if you think you may be experiencing any of these sensations, or if you have other questions regarding possible side effects.  Follow all other discharge instructions given to you by your surgeon or nurse. Eat a healthy diet and drink plenty of water or other fluids.  If you return to the hospital for any reason within 96 hours following the administration of EXPAREL, it is important for health care providers to know that you have received this anesthetic. A teal colored band has been placed  on your arm with the date, time and amount of EXPAREL you have received in order to alert and inform your health care providers. Please leave this armband in place for the full 96 hours following administration, and then you may remove the band.

## 2022-08-20 NOTE — Transfer of Care (Signed)
Immediate Anesthesia Transfer of Care Note  Patient: Chloe Reid  Procedure(s) Performed: LEFT BREAST LUMPECTOMY WITH RADIOACTIVE SEED AND SENTINEL LYMPH NODE BIOPSY (Left: Breast)  Patient Location: PACU  Anesthesia Type:General  Level of Consciousness: sedated  Airway & Oxygen Therapy: Patient Spontanous Breathing and Patient connected to face mask oxygen  Post-op Assessment: Report given to RN and Post -op Vital signs reviewed and stable  Post vital signs: Reviewed and stable  Last Vitals:  Vitals Value Taken Time  BP 117/73 08/20/22 1052  Temp    Pulse 83 08/20/22 1053  Resp 18 08/20/22 1053  SpO2 100 % 08/20/22 1053  Vitals shown include unvalidated device data.  Last Pain:  Vitals:   08/20/22 0733  TempSrc: Oral  PainSc: 0-No pain      Patients Stated Pain Goal: 7 (43/70/05 2591)  Complications: No notable events documented.

## 2022-08-20 NOTE — Anesthesia Procedure Notes (Signed)
Procedure Name: LMA Insertion Date/Time: 08/20/2022 9:22 AM  Performed by: Willa Frater, CRNAPre-anesthesia Checklist: Patient identified, Emergency Drugs available, Suction available and Patient being monitored Patient Re-evaluated:Patient Re-evaluated prior to induction Oxygen Delivery Method: Circle system utilized Preoxygenation: Pre-oxygenation with 100% oxygen Induction Type: IV induction Ventilation: Mask ventilation without difficulty LMA: LMA inserted LMA Size: 4.0 Number of attempts: 1 Airway Equipment and Method: Bite block Placement Confirmation: positive ETCO2 Tube secured with: Tape Dental Injury: Teeth and Oropharynx as per pre-operative assessment

## 2022-08-20 NOTE — Op Note (Signed)
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 The risks, benefits, complications, treatment options, and expected outcomes were discussed with the patient. The possibilities of bleeding, infection, the need for additional procedures, failure to diagnose a condition, and creating a complication requiring transfusion or operation were discussed with the patient. The patient concurred with the proposed plan, giving informed consent.  The site of surgery properly noted/marked. The patient was taken to Operating Room # 8, identified, and the procedure verified as Left Breast Seed localized Lumpectomy with sentinel lymph node biopsy. The left arm, breast, and chest were prepped and draped in standard fashion. A Time Out was held and the above information confirmed. The magtrace was injected into the subareolar location and massaged for 5 minutes.    The lumpectomy was performed by creating a transverse lateral incision near the previously placed radioactive seed.  Dissection was carried down to around the point of maximum signal intensity. The cautery was used to perform the dissection.  Hemostasis was achieved with cautery. The edges of the cavity were marked with large clips, with one each medial, lateral, inferior and superior, and two clips posteriorly.   The specimen was inked with the margin marker paint kit.    Specimen radiography confirmed inclusion of the mammographic lesion, the clip, and the seed.  3D image of the specimen showed the lesion and the  seed in the center.  The background signal in the breast was zero.  The wound was irrigated and closed with 3-0 vicryl in layers and 4-0 monocryl subcuticular suture.    Using a hand-held sentimag probe, left axillary sentinel nodes were identified transcutaneously.  An oblique incision was created below the axillary hairline.  Dissection was carried through the clavipectoral fascia.  Three deep level 2 axillary sentinel nodes were removed.  Counts per second were 50, 30, and 500.    The background count was 10 cps.  The wound was irrigated.  Hemostasis was achieved with cautery.  The axillary incision was closed with a 3-0 vicryl deep dermal interrupted sutures and a 4-0 monocryl subcuticular closure.    Sterile dressings were applied. At the end of the operation, all sponge, instrument, and needle counts were correct.  Findings: grossly clear surgical margins and no adenopathy, anterior margin is skin.    Estimated Blood Loss:  min         Specimens: left breast tissue with seed and three left axillary sentinel lymph nodes.             Complications:  None; patient tolerated the procedure well.         Disposition: PACU - hemodynamically stable.         Condition: stable

## 2022-08-20 NOTE — Anesthesia Postprocedure Evaluation (Signed)
Anesthesia Post Note  Patient: Chloe Reid  Procedure(s) Performed: LEFT BREAST LUMPECTOMY WITH RADIOACTIVE SEED AND SENTINEL LYMPH NODE BIOPSY (Left: Breast)     Patient location during evaluation: PACU Anesthesia Type: General Level of consciousness: awake and alert and oriented Pain management: pain level controlled Vital Signs Assessment: post-procedure vital signs reviewed and stable Respiratory status: spontaneous breathing, nonlabored ventilation and respiratory function stable Cardiovascular status: blood pressure returned to baseline and stable Postop Assessment: no apparent nausea or vomiting Anesthetic complications: no   No notable events documented.  Last Vitals:  Vitals:   08/20/22 1100 08/20/22 1115  BP: 113/74 123/74  Pulse: 78 79  Resp: 13 18  Temp:    SpO2: 100% 100%    Last Pain:  Vitals:   08/20/22 1126  TempSrc:   PainSc: 3                  Wendle Kina A.

## 2022-08-21 ENCOUNTER — Encounter (HOSPITAL_BASED_OUTPATIENT_CLINIC_OR_DEPARTMENT_OTHER): Payer: Self-pay | Admitting: General Surgery

## 2022-08-25 LAB — SURGICAL PATHOLOGY

## 2022-08-25 NOTE — Progress Notes (Signed)
Patient Care Team: Debbrah Alar, NP as PCP - General (Internal Medicine) Stark Klein, MD as Consulting Physician (General Surgery) [name], MD as Consulting Physician (Hematology and Oncology) Gery Pray, MD as Consulting Physician (Radiation Oncology) Rockwell Germany, RN as Oncology Nurse Navigator Mauro Kaufmann, RN as Oncology Nurse Navigator  DIAGNOSIS: No diagnosis found.  SUMMARY OF ONCOLOGIC HISTORY: Oncology History  Malignant neoplasm of upper-outer quadrant of left breast in female, estrogen receptor positive ([place])  07/01/2022 Initial Diagnosis   Screening mammogram detected left breast mass at 2 o'clock position 1 cm in size.  Ultrasound-guided biopsy revealed grade 2 IDC with DCIS intermediate grade, axilla negative, ER 95%, PR 20%, Ki-67 5%, HER2 negative   07/09/2022 Cancer Staging   Staging form: Breast, AJCC 8th Edition - Clinical: Stage IA (cT1b, cN0, cM0, G2, ER+, PR+, HER2-) - Signed by [name], MD on 07/09/2022 Stage prefix: Initial diagnosis Histologic grading system: 3 grade system     CHIEF COMPLIANT: Follow after surgery   INTERVAL HISTORY: Chloe Reid is a 66 y.o. reports to the clinic today for a follow-up after surgery.   ALLERGIES:  is allergic to lisinopril.  MEDICATIONS:  Current Outpatient Medications  Medication Sig Dispense Refill   acetaminophen (TYLENOL) 500 MG tablet Take 1 tablet (500 mg total) by mouth every 6 (six) hours as needed. 30 tablet 0   atorvastatin (LIPITOR) 20 MG tablet Take 1 tablet (20 mg total) by mouth daily. 90 tablet 1   calcium-vitamin D (OSCAL WITH D) 500-200 MG-UNIT tablet Take 1 tablet by mouth 3 (three) times daily. 90 tablet 12   oxyCODONE (OXY IR/ROXICODONE) 5 MG immediate release tablet Take 1 tablet (5 mg total) by mouth every 6 (six) hours as needed for severe pain. 5 tablet 0   valsartan (DIOVAN) 40 MG tablet Take 0.5 tablets (20 mg total) by mouth daily. 90 tablet 3   Zoster Vaccine  Adjuvanted [facility]) injection Inject 0.5 mcg IM now and again in 2-6 months. 0.5 mL 1   No current facility-administered medications for this visit.    PHYSICAL EXAMINATION: ECOG PERFORMANCE STATUS: {CHL ONC ECOG PS:[phone]}  There were no vitals filed for this visit. There were no vitals filed for this visit.  BREAST:*** No palpable masses or nodules in either right or left breasts. No palpable axillary supraclavicular or infraclavicular adenopathy no breast tenderness or nipple discharge. (exam performed in the presence of a chaperone)  LABORATORY DATA:  I have reviewed the data as listed    Latest Ref Rng & Units 08/12/2022    5:32 PM 07/09/2022    1:00 PM 06/12/2022   12:02 PM  CMP  Glucose 70 - 99 mg/dL 125  96  100   BUN 6 - 23 mg/dL 18  13  14    Creatinine 0.40 - 1.20 mg/dL 0.71  0.66  0.70   Sodium 135 - 145 mEq/L 137  138  138   Potassium 3.5 - 5.1 mEq/L 4.1  4.0  4.3   Chloride 96 - 112 mEq/L 102  105  106   CO2 19 - 32 mEq/L 26  26  24    Calcium 8.4 - 10.5 mg/dL 9.4  9.4  9.2   Total Protein 6.5 - 8.1 g/dL  8.1  7.7   Total Bilirubin 0.3 - 1.2 mg/dL  0.4  0.7   Alkaline Phos 38 - 126 U/L  59  60   AST 15 - 41 U/L  16  19  ALT 0 - 44 U/L  16  17     Lab Results  Component Value Date   WBC 9.5 07/09/2022   HGB 12.0 07/09/2022   HCT 36.5 07/09/2022   MCV 77.3 (L) 07/09/2022   PLT 272 07/09/2022   NEUTROABS 5.6 07/09/2022    ASSESSMENT & PLAN:  No problem-specific Assessment & Plan notes found for this encounter.    No orders of the defined types were placed in this encounter.  The patient has a good understanding of the overall plan. she agrees with it. she will call with any problems that may develop before the next visit here. Total time spent: 30 mins including face to face time and time spent for planning, charting and co-ordination of care   Suzzette Righter, [place] 08/25/22    I Gardiner Coins am scribing for Dr. Lindi Adie  ***

## 2022-08-26 ENCOUNTER — Telehealth: Payer: Self-pay | Admitting: *Deleted

## 2022-08-26 ENCOUNTER — Encounter: Payer: Self-pay | Admitting: *Deleted

## 2022-08-26 NOTE — Telephone Encounter (Signed)
Received order for oncotype testing. Requisition faxed to pathology 

## 2022-08-27 ENCOUNTER — Other Ambulatory Visit (INDEPENDENT_AMBULATORY_CARE_PROVIDER_SITE_OTHER): Payer: Medicare Other

## 2022-08-27 ENCOUNTER — Ambulatory Visit (INDEPENDENT_AMBULATORY_CARE_PROVIDER_SITE_OTHER): Payer: Medicare Other

## 2022-08-27 DIAGNOSIS — R030 Elevated blood-pressure reading, without diagnosis of hypertension: Secondary | ICD-10-CM

## 2022-08-27 DIAGNOSIS — R809 Proteinuria, unspecified: Secondary | ICD-10-CM

## 2022-08-27 LAB — BASIC METABOLIC PANEL
BUN: 10 mg/dL (ref 6–23)
CO2: 26 mEq/L (ref 19–32)
Calcium: 9.4 mg/dL (ref 8.4–10.5)
Chloride: 103 mEq/L (ref 96–112)
Creatinine, Ser: 0.7 mg/dL (ref 0.40–1.20)
GFR: 88.93 mL/min (ref >60.00)
Glucose, Bld: 107 mg/dL — ABNORMAL HIGH (ref 70–99)
Potassium: 4.4 mEq/L (ref 3.5–5.1)
Sodium: 137 mEq/L (ref 135–145)

## 2022-08-27 NOTE — Progress Notes (Signed)
Pt here for Blood pressure check per Inda Castle, [name] NP  Pt currently takes: Valsartan 20 mg   Pt reports compliance with medication.  BP today @ =120/78 HR =70  Pt advised per Debbrah Alar no changes , follow up with PCP in 3 months

## 2022-08-28 ENCOUNTER — Inpatient Hospital Stay: Payer: Medicare Other | Attending: Hematology and Oncology | Admitting: Hematology and Oncology

## 2022-08-28 DIAGNOSIS — C50412 Malignant neoplasm of upper-outer quadrant of left female breast: Secondary | ICD-10-CM | POA: Diagnosis present

## 2022-08-28 DIAGNOSIS — Z170 Estrogen receptor positive status [ER+]: Secondary | ICD-10-CM | POA: Diagnosis not present

## 2022-08-28 DIAGNOSIS — Z79810 Long term (current) use of selective estrogen receptor modulators (SERMs): Secondary | ICD-10-CM | POA: Insufficient documentation

## 2022-08-28 NOTE — Assessment & Plan Note (Signed)
08/20/2022:Left lumpectomy: Grade 2 IDC 1.8 cm, extensive perineural invasion.  With DCIS grade 2, margins negative, 0/3 lymph nodes negative ER 95%, PR 20%, HER2 1+ negative, Ki-67 5%  Pathology counseling: I discussed the final pathology report of the patient provided  a copy of this report. I discussed the margins as well as lymph node surgeries. We also discussed the final staging along with previously performed ER/PR and HER-2/neu testing.  Treatment plan: 1.  Oncotype DX testing to determine chemotherapy benefit 2. adjuvant radiation 3.  Adjuvant antiestrogen therapy  Return to clinic based upon Oncotype DX test result

## 2022-09-04 ENCOUNTER — Encounter (HOSPITAL_COMMUNITY): Payer: Self-pay

## 2022-09-05 ENCOUNTER — Telehealth: Payer: Self-pay | Admitting: Hematology and Oncology

## 2022-09-05 NOTE — Telephone Encounter (Signed)
I informed the patient's daughter that her mother's Oncotype came back as low risk with a score of 20.  This translates to distant recurrence of 6%.  Therefore there is no role of chemotherapy.  We will request for radiation appointment.

## 2022-09-08 ENCOUNTER — Encounter: Payer: Self-pay | Admitting: *Deleted

## 2022-09-08 DIAGNOSIS — C50412 Malignant neoplasm of upper-outer quadrant of left female breast: Secondary | ICD-10-CM

## 2022-09-09 ENCOUNTER — Telehealth: Payer: Self-pay | Admitting: Radiation Oncology

## 2022-09-09 ENCOUNTER — Encounter (HOSPITAL_COMMUNITY): Payer: Self-pay

## 2022-09-09 NOTE — Telephone Encounter (Signed)
Called patient using interpreter line to schedule a consultation w. Dr. Isidore Moos. No answer, LVM for a return call.

## 2022-09-10 ENCOUNTER — Ambulatory Visit: Payer: Medicare Other

## 2022-09-10 ENCOUNTER — Telehealth: Payer: Self-pay | Admitting: Radiation Oncology

## 2022-09-10 NOTE — Telephone Encounter (Signed)
Called patient using the interpreter line to schedule a consultation w. Dr. Isidore Moos. No answer, LVM for a return call.

## 2022-09-10 NOTE — Telephone Encounter (Signed)
LVM with patient's daughter to schedule a consultation w. Dr. Isidore Moos. No answer, LVM for a return call.

## 2022-09-12 NOTE — Progress Notes (Signed)
Location of Breast Cancer:  Malignant neoplasm of upper-outer quadrant of left breast in female, estrogen receptor positive  Histology per Pathology Report:  08/20/2022 A. BREAST, LEFT, LUMPECTOMY: -  Invasive ductal carcinoma (NOS)/invasive carcinoma, NST, Nottingham histologic score 2 of 3 (tubular score 3/3; nuclear pleomorphism 2/3; mitotic index 1/3) -  1.7 cm in greatest dimension, see note -  Extensive perineural invasion. -  Negative for lymphovascular invasion. 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 LYMPH NODE, LEFT AXILLARY #1, SENTINEL, EXCISION: -  1 lymph node, negative for malignancy (0/1), multiple levels examined. C. LYMPH NODE, LEFT AXILLARY #2, SENTINEL, EXCISION: -  1 lymph node, negative for malignancy (0/1), multiple levels examined. D. LYMPH NODE, LEFT AXILLARY #3, SENTINEL, EXCISION: -  1 lymph node, negative for malignancy (0/1), multiple levels examined.   Receptor Status: ER(95%), PR (20%), Her2-neu (Negative), Ki-67(5%)  Did patient present with symptoms (if so, please note symptoms) or was this found on screening mammography?: Routine screening mammogram detected left breast mass at 2 o'clock position 1 cm in size   Past/Anticipated interventions by surgeon, if any:  09/05/2022 --Dr. Stark Klein (office visit)  Will do 1 week of antibiotics Add gabapentin. Wrote out taper for her.  I will see back in 2 weeks.  Pt will follow up with radiation.  This will be followed by antihormonal tx. Mammogram due 06/2023 at [facility].  Return in about 2 weeks (around 09/19/2022) for breast cancer follow up.   08/20/2022 --Dr. Stark Klein  Left Breast Radioactive seed localized lumpectomy  Sentinel lymph node biopsy  Past/Anticipated interventions by medical oncology, if any:  Under care of Dr. [name] 08/28/2022 --Treatment plan: Oncotype DX testing to determine chemotherapy benefit Adjuvant radiation Adjuvant antiestrogen therapy --Return to clinic based upon Oncotype DX test result 09/05/2022 telephone call: "I informed the patient's daughter that her mother's Oncotype came back as low risk with a score of 20.  This translates to distant recurrence of 6%.  Therefore there is no role of chemotherapy.  We will request for radiation appointment."  Lymphedema issues, if any:  Denies    Pain issues, if any:  Reports pain to the underside of her left arm and center of breast. Currently only taking gabapentin at night because it makes her feel so tired  SAFETY ISSUES: Prior radiation? No Pacemaker/ICD? No Possible current pregnancy? No--postmenopausal  Is the patient on methotrexate? No  Current Complaints / other details:  Helps care for grandkids during the day

## 2022-09-15 ENCOUNTER — Encounter: Payer: Self-pay | Admitting: *Deleted

## 2022-09-15 NOTE — Progress Notes (Signed)
Radiation Oncology         [phone]) [phone] ________________________________  Outpatient Followup  Name: Chloe Reid MRN: [id number]  Date: 09/16/2022  DOB: 05-17-1956  CC:[name], [name], NP  Chloe Lose, MD   REFERRING PHYSICIAN: [name], MD  DIAGNOSIS:    ICD-10-CM   1. 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 female who seen in the multidisciplinary breast clinic by Chloe Reid on 07/09/22. She returns today for further discussion of radiation therapy in management of her diagnosed left breast cancer (patient requested change in provider).   She had routine screening mammography on  06/10/22 showing a possible abnormality in the left breast. She underwent  unilateral left diagnostic mammography with tomography and left breast ultrasonography at [facility] on 06/23/22 showing: a highly suspicious irregular hypoechoic 2 o'clock left breast mass, 5 cmfn, measuring 10 x 7 x 9 mm. No axillary adenopathy was appreciated.   Biopsy of the 2 o'clock left breast mass on 07/01/22 showed: grade 2 invasive ductal carcinoma with intermediate grade DCIS measuring 1.2 cm in the greatest linear extent. Prognostic indicators significant for: estrogen receptor, 95% positive and progesterone receptor, 20% positive, both with strong staining intensity. Proliferation marker Ki67 at 5%. HER2 negative. No lymph nodes were examined.   The patient opted to proceed with left breast lumpectomy with nodal biopsies on 08/20/22 under the care of Chloe Reid. 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 Prognostic indicators significant for: estrogen receptor 95% positive and progesterone receptor 20% positive, both with strong staining intensity; Proliferation marker Ki67 at 5%; Her2 status negative; Grade 2.   Oncotype DX was obtained on the final surgical sample and the recurrence score of 20 predicts a risk of recurrence outside the breast over the next 9 years of 6%, if the patient's only systemic therapy is an antiestrogen for 5 years.  It also predicts no significant benefit from chemotherapy.  Given her Oncotype results, the patient will not require chemotherapy and will return to Dr. Lindi Reid in the near future to discuss antiestrogen treatment options.   During her most recent follow-up visit with Chloe Reid on 09/05/22, the patient complained of burning in her arm and shoulder. Examination performed during this visit showed faint pinkness and mild swelling to the left breast area. A minimal breast seroma was also appreciated, for which Chloe Reid prescribed her a 1 week course of doxycycline and gabapentin. She follow-up with Chloe Reid later this week to assess her progress.   Menarche: 66 years old Age at first live birth: 66 years old GP: 4 LMP: in 1999 Contraceptive:  never used  HRT: never used    PREVIOUS RADIATION THERAPY: No  PAST MEDICAL HISTORY:  has a past medical history of Breast cancer ([place]), Cataracts, bilateral, Glaucoma, Hypertension, Kidney stone, Macular drusen,  Migraine, and Pre-diabetes.    PAST SURGICAL HISTORY: Past Surgical History:  Procedure Laterality Date   BREAST LUMPECTOMY WITH RADIOACTIVE SEED AND SENTINEL LYMPH NODE BIOPSY Left 08/20/2022   Procedure: LEFT BREAST LUMPECTOMY WITH RADIOACTIVE SEED AND SENTINEL LYMPH NODE BIOPSY;  Surgeon: Chloe Klein, MD;  Location: [facility];  Service: General;  Laterality: Left;   EYE SURGERY Right    cataract   uterine biopsy      FAMILY HISTORY: family history includes Heart disease in her father and mother; Kidney disease in her father.  SOCIAL HISTORY:  reports that she has never smoked. She has never used smokeless tobacco. She reports that she does not drink alcohol and does not use drugs.  ALLERGIES: Lisinopril  MEDICATIONS:  Current Outpatient Medications  Medication Sig Dispense Refill   doxycycline (MONODOX) 100 MG capsule Take 1 capsule by mouth 2 (two) times daily.     gabapentin (NEURONTIN) 100 MG capsule Take 100 mg by mouth 3 (three) times daily.     acetaminophen (TYLENOL) 500 MG tablet Take 1 tablet (500 mg total) by mouth every 6 (six) hours as needed. 30 tablet 0   atorvastatin (LIPITOR) 20 MG tablet Take 1 tablet (20 mg total) by mouth daily. 90 tablet 1   calcium-vitamin D (OSCAL WITH D) 500-200 MG-UNIT tablet Take 1 tablet by mouth 3 (three) times daily. 90 tablet 12   methocarbamol (ROBAXIN) 500 MG tablet Take by mouth.     oxyCODONE (OXY IR/ROXICODONE) 5 MG immediate release tablet Take 1 tablet (5 mg total) by mouth every 6 (six) hours as needed for severe pain. 5 tablet 0   prednisoLONE acetate (PRED FORTE) 1 % ophthalmic suspension Place 1 drop into the left eye 4 times daily. Start after you arrive home from surgery.     valsartan (DIOVAN) 40 MG tablet Take 0.5 tablets (20 mg total) by mouth daily. 90 tablet 3   Zoster Vaccine Adjuvanted [facility]) injection Inject 0.5 mcg IM now and again in 2-6 months. 0.5 mL 1   No current facility-administered  medications for this encounter.    REVIEW OF SYSTEMS: As above in HPI.   PHYSICAL EXAM:  height is 5' 3"  (1.6 m) and weight is 158 lb 2 oz (71.7 kg). Her oral temperature is 97.8 F (36.6 C). Her blood pressure is 168/87 (abnormal) and her pulse is 66. Her respiration is 18 and oxygen saturation is 100%.   General: Alert and oriented, in no acute distress HEENT: Head is normocephalic. Extraocular movements are intact.   Heart: Regular in rate and rhythm with no murmurs, rubs, or gallops. Chest: Clear to auscultation bilaterally, with no rhonchi, wheezes, or rales. Lymphatics: no axillary masses Skin: No concerning lesions over chest. Musculoskeletal: ambulatory, able to get onto exam table independently Neurologic: Cranial nerves II through XII are grossly intact. No obvious focalities. Speech is fluent. Coordination is intact. Psychiatric: Judgment and insight are intact. Affect is appropriate. Breasts: 3:00 firmness, left breast, r/t recent surgery. Firmness in axillary region, left, r/t recent surgery. Incision sites healing satisfactorily. No other palpable masses appreciated in the breasts or axillae b/l .  ECOG = 1  0 - Asymptomatic (Fully active, able to carry on all predisease activities without restriction)  1 - Symptomatic but completely  ambulatory (Restricted in physically strenuous activity but ambulatory and able to carry out work of a light or sedentary nature. For example, light housework, office work)  2 - Symptomatic, <50% in bed during the day (Ambulatory and capable of all self care but unable to carry out any work activities. Up and about more than 50% of waking hours)  3 - Symptomatic, >50% in bed, but not bedbound (Capable of only limited self-care, confined to bed or chair 50% or more of waking hours)  4 - Bedbound (Completely disabled. Cannot carry on any self-care. Totally confined to bed or chair)  5 - Death   Eustace Pen MM, Creech RH, Tormey DC, et al. [phone]).  "Toxicity and response criteria of the [facility]". [place] Oncol. 5 (6): 649-55   LABORATORY DATA:  Lab Results  Component Value Date   WBC 9.5 07/09/2022   HGB 12.0 07/09/2022   HCT 36.5 07/09/2022   MCV 77.3 (L) 07/09/2022   PLT 272 07/09/2022   CMP     Component Value Date/Time   NA 137 08/27/2022 1020   NA 138 06/18/2017 1218   K 4.4 08/27/2022 1020   CL 103 08/27/2022 1020   CO2 26 08/27/2022 1020   GLUCOSE 107 (H) 08/27/2022 1020   BUN 10 08/27/2022 1020   BUN 20 06/18/2017 1218   CREATININE 0.70 08/27/2022 1020   CREATININE 0.66 07/09/2022 1300   CREATININE 0.61 07/07/2014 1152   CALCIUM 9.4 08/27/2022 1020   PROT 8.1 07/09/2022 1300   PROT 7.6 06/18/2017 1218   ALBUMIN 4.0 07/09/2022 1300   ALBUMIN 4.3 06/18/2017 1218   AST 16 07/09/2022 1300   ALT 16 07/09/2022 1300   ALKPHOS 59 07/09/2022 1300   BILITOT 0.4 07/09/2022 1300   GFRNONAA >60 07/09/2022 1300   GFRNONAA >89 07/07/2014 1152   GFRAA >60 01/17/2018 1825   GFRAA >89 07/07/2014 1152         RADIOGRAPHY: MM Breast Surgical Specimen  Result Date: 08/20/2022 CLINICAL DATA:  Specimen radiograph. EXAM: SPECIMEN RADIOGRAPH OF THE LEFT BREAST COMPARISON:  Previous exam(s). FINDINGS: Status post excision of the left breast. The radioactive seed and ribbon shaped biopsy marker clip are present, completely intact, and were marked for pathology. IMPRESSION: Specimen radiograph of the left breast. Electronically Signed   By: Lillia Mountain M.D.   On: 08/20/2022 10:11  MM LT RADIOACTIVE SEED LOC MAMMO GUIDE  Result Date: 08/19/2022 CLINICAL DATA:  66 year old female with recently diagnosed left breast invasive ductal carcinoma presenting for seed localization. EXAM: MAMMOGRAPHIC GUIDED RADIOACTIVE SEED LOCALIZATION OF THE LEFT BREAST COMPARISON:  Previous exam(s). FINDINGS: Patient presents for radioactive seed localization prior to left breast lumpectomy. I met with the patient and we  discussed the procedure of seed localization including benefits and alternatives. We discussed the high likelihood of a successful procedure. We discussed the risks of the procedure including infection, bleeding, tissue injury and further surgery. We discussed the low dose of radioactivity involved in the procedure. Informed, written consent was given. The usual time-out protocol was performed immediately prior to the procedure. Using mammographic guidance, sterile technique, 1% lidocaine and an I-125 radioactive seed, the mass with ribbon clip in the left breast was localized using a lateral approach. The follow-up mammogram images confirm the seed in the expected location and were marked for Chloe Reid. Follow-up survey of the patient confirms presence of the radioactive seed. Order number of I-125 seed:  [id number]. Total activity: 0.258 mCi  reference  Date: 21 Jul 2022 The patient tolerated the procedure well and was released from the [facility]. She was given instructions regarding seed removal. IMPRESSION: Radioactive seed localization left breast. No apparent complications. Electronically Signed   By: Audie Pinto M.D.   On: 08/19/2022 09:59     IMPRESSION/PLAN: LEFT breast cancer, STAGE IA    We discussed adjuvant radiotherapy today.  I recommend 4 weeks of radiation therapy to the left breast in order to reduce risk of locoregional recurrence by 2/3 .  I reviewed the logistics, benefits, risks, and potential side effects of this treatment in detail. Risks may include but not necessary be limited to acute and late injury tissue in the radiation fields such as skin irritation (change in color/pigmentation, itching, dryness, pain, peeling). She may experience fatigue. We also discussed possible risk of long term cosmetic changes or scar tissue. There is also a smaller risk for lung toxicity, cardiac toxicity, lymphedema, musculoskeletal changes, rib fragility or induction of a second malignancy,  late chronic non-healing soft tissue wound.    She has follow-up later this week with her surgeon.  She does report some tightness in the triceps area on the left and she will discuss with her surgeon whether physical therapy referral is in her best interest.  She does see another physical therapist for prior issues with her right, contralateral arm.  The patient asked good questions (and her daughter asked excellent questions too) which I answered to their satisfaction. She is enthusiastic about proceeding with treatment. A consent form has been  signed and placed in her chart.  We will schedule her for treatment planning next week.  On date of service, in total, I spent 40 minutes on this encounter. Patient was seen in person.   __________________________________________   Eppie Gibson, MD  This document serves as a record of services personally performed by Eppie Gibson, MD. It was created on her behalf by Roney Mans, a trained medical scribe. The creation of this record is based on the scribe's personal observations and the provider's statements to them. This document has been checked and approved by the attending provider.

## 2022-09-16 ENCOUNTER — Ambulatory Visit
Admission: RE | Admit: 2022-09-16 | Discharge: 2022-09-16 | Disposition: A | Discharge status: Home / Self Care | Payer: Medicare Other | Source: Ambulatory Visit | Attending: Radiation Oncology | Admitting: Radiation Oncology

## 2022-09-16 ENCOUNTER — Encounter: Payer: Self-pay | Admitting: Radiation Oncology

## 2022-09-16 ENCOUNTER — Other Ambulatory Visit: Payer: Self-pay

## 2022-09-16 VITALS — BP 168/87 | HR 66 | Temp 97.8°F | Resp 18 | Ht 63.0 in | Wt 158.1 lb

## 2022-09-16 DIAGNOSIS — Z170 Estrogen receptor positive status [ER+]: Secondary | ICD-10-CM | POA: Diagnosis not present

## 2022-09-16 DIAGNOSIS — Z79899 Other long term (current) drug therapy: Secondary | ICD-10-CM | POA: Diagnosis not present

## 2022-09-16 DIAGNOSIS — C50412 Malignant neoplasm of upper-outer quadrant of left female breast: Secondary | ICD-10-CM | POA: Insufficient documentation

## 2022-09-16 DIAGNOSIS — Z87442 Personal history of urinary calculi: Secondary | ICD-10-CM | POA: Diagnosis not present

## 2022-09-16 DIAGNOSIS — R55 Syncope and collapse: Secondary | ICD-10-CM | POA: Diagnosis not present

## 2022-09-16 DIAGNOSIS — I10 Essential (primary) hypertension: Secondary | ICD-10-CM | POA: Insufficient documentation

## 2022-09-17 NOTE — Therapy (Signed)
OUTPATIENT PHYSICAL THERAPY BREAST CANCER POST OP FOLLOW UP   Patient Name: Chloe Reid MRN: [id number] DOB:26-Aug-1956, 66 y.o., female Today's Date: 09/18/2022   PT End of Session - 09/18/22 0809     Visit Number 2    Number of Visits 3    Date for PT Re-Evaluation 10/16/22    PT Start Time 0811   pt late   PT Stop Time 0900    PT Time Calculation (min) 49 min    Activity Tolerance Patient tolerated treatment well    Behavior During Therapy WFL for tasks assessed/performed             Past Medical History:  Diagnosis Date   Breast cancer ([place])    left breast IDC with DCIS   Cataracts, bilateral    Glaucoma    Hypertension    Kidney stone    Macular drusen    Migraine    Pre-diabetes    Past Surgical History:  Procedure Laterality Date   BREAST LUMPECTOMY WITH RADIOACTIVE SEED AND SENTINEL LYMPH NODE BIOPSY Left 08/20/2022   Procedure: LEFT BREAST LUMPECTOMY WITH RADIOACTIVE SEED AND SENTINEL LYMPH NODE BIOPSY;  Surgeon: Stark Klein, MD;  Location: [facility];  Service: General;  Laterality: Left;   EYE SURGERY Right    cataract   uterine biopsy     Patient Active Problem List   Diagnosis Date Noted   Malignant neoplasm of upper-outer quadrant of left breast in female, estrogen receptor positive ([place]) 07/07/2022   Microalbuminuria due to type 2 diabetes mellitus ([place]) 05/14/2022   Macular drusen    Kidney stone    Glaucoma    Cataracts, bilateral    Acute pain of left knee 06/05/2017   Onychomycosis 07/09/2014   Chest pain 03/24/2014   Urinary incontinence 03/24/2014   Controlled type 2 diabetes mellitus with microalbuminuria, without long-term current use of insulin ([place]) 03/13/2014   Hyperlipidemia 03/13/2014   Routine general medical examination at a health care facility 03/10/2014   Migraine 03/10/2014    PCP: Debbrah Alar NP  REFERRING PROVIDER: Dr, Stark Klein  REFERRING DIAG: Left Breast Cancer  THERAPY DIAG:  Malignant  neoplasm of upper-outer quadrant of left breast in female, estrogen receptor positive ([place])  Abnormal posture  Rationale for Evaluation and Treatment Rehabilitation  ONSET DATE: 06/10/2022  SUBJECTIVE:                                                                                                                                                                                           SUBJECTIVE STATEMENT: Having some burning pain at left medial arm and axilla.  Is still taking antiobiotics and gabapentin but doesn't see any difference. 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 Barry Dienes on 09/05/22, the patient complained of burning in her arm and shoulder. Examination by MD showed faint pinkness and mild swelling to the left breast area. A minimal breast seroma was also appreciated, for which Dr. Barry Dienes prescribed her a 1 week course of doxycycline and gabapentin. She  will follow-up with Dr. Barry Dienes on 09/19/2022 this week to assess her progress  PATIENT GOALS:  Reassess how my recovery is going related to arm function, pain, and swelling.  PAIN:  Are you having pain? Yes: NPRS scale: 2/10- 6/10 Pain location: left posterior arm and occasionally breast Pain description: burning,achy Aggravating factors: not sure Relieving factors: rest, tylenol  PRECAUTIONS: Recent Surgery, left UE Lymphedema risk,   ACTIVITY LEVEL / LEISURE: cares for family baby, works in [employer]   OBJECTIVE:   PATIENT SURVEYS:  QUICK DASH: 31.82%  OBSERVATIONS:  Breast incision with area of induration with fairly defined edges, axillary incision with mild fibrosis. Glue/small scabs still present  POSTURE:  Forward head,  round shoulders  LYMPHEDEMA ASSESSMENT:    UPPER EXTREMITY AROM/PROM:   A/PROM RIGHT   eval    Shoulder extension 55  Shoulder flexion 151  Shoulder abduction 155  Shoulder internal rotation 67  Shoulder external rotation 87                          (Blank rows = not tested)   A/PROM LEFT   eval Left 09/18/2022  Shoulder extension 51 58  Shoulder flexion 150 160 med arm  Shoulder abduction 172 168  Shoulder internal rotation 75 72  Shoulder external rotation 85 88                          (Blank rows = not tested)     CERVICAL AROM: All within functional limits     UPPER EXTREMITY STRENGTH: WFL     LYMPHEDEMA ASSESSMENTS:    LANDMARK RIGHT   eval Right 09/18/2022  10 cm proximal to olecranon process 27.8 28.8  Olecranon process 24.6 24.8  10 cm proximal to ulnar styloid process 20.7 20.9  Just proximal to ulnar styloid process 14.8 15.2  Across hand at thumb web space 18.7 19.2  At base of 2nd digit 6 6.2  (Blank rows = not tested)   LANDMARK LEFT   eval LEFT 09/18/2022  10 cm proximal to olecranon process 28.5 30.4  Olecranon process 25.5 25.5  10 cm proximal to ulnar styloid process 20.7 21.1  Just proximal to ulnar styloid process 15.3 15.5  Across hand at thumb web space 19.1 18.9  At base of 2nd digit 5.8 5.8  (Blank rows = not tested)        Surgery type/Date: Left lumpectomy with SLNB Number of lymph nodes removed: 0/3 Current/past treatment (chemo, radiation, hormone therapy): Starting radiation in November, anti estrogens. Other symptoms:  Heaviness/tightness Yes Pain Yes Pitting edema No Infections Yesbeing treated for possible left breast infection Decreased scar mobility not assessed Stemmer sign No   PATIENT EDUCATION:  Education details: continue exercises during radiation,  SOZO, ABC class,  Person educated: Patient and Child(ren)Suzannah Education method: Explanation, Demonstration, and Handouts Education comprehension:  verbalized understanding, returned demonstration, and needs further education   HOME EXERCISE PROGRAM:  Reviewed previously given post op HEP. Had pt perform all x 5  ASSESSMENT:  CLINICAL IMPRESSION: Pt is s/p left lumpectomy with SLNB for invasive ductal carcinoma and 0/3 LN's. She presents with good restoration of motion with mild deficit in ROM for abduction. There is slight increase in circumference at 10 cm proximal to olecranon. There is palpable firmness near the breast incision and some fibrosis at the axillary incision. Foam pads were made for each spot to help decrease firmness. She will wear with her compression bra. She is still complaining of posterior arm burning pain/numbness likely related to nerves. No cording noted today.  Pt will benefit from skilled therapeutic intervention to improve on the following deficits: Decreased knowledge of precautions, impaired UE functional use, pain, decreased ROM, postural dysfunction.   PT treatment/interventions: ADL/Self care home management, Therapeutic exercises, Patient/Family education, Self Care, Manual lymph drainage, scar mobilization, Manual therapy, and Re-evaluation     GOALS: Goals reviewed with patient? Yes  LONG TERM GOALS:  (STG=LTG)  GOALS Name Target Date  Goal status  1 Pt will demonstrate she has regained full shoulder ROM and function post operatively compared to baselines.  Baseline: 10/16/2022 IN PROGRESS  2 Pt will have no increase in circumference of left UE at 10 cm proximal to olecranon 10/16/2022 INITIAL  3 Pt will notice breast softening with foam pads 10/16/2022 INITIAL  4        PLAN: PT FREQUENCY/DURATION: pt to come 1 more time in 2 weeks  PLAN FOR NEXT SESSION: reassess ROM, check left UE circumference especially at 10 cm prox to olecranon,check breast firmness, instruct scar massage if ready, desensitization techniques for posterior arm burning   [facility]  [address], [address]  [place] [address]  [phone]  After Breast Cancer Class It is recommended you attend the ABC class to be educated on lymphedema risk reduction. This class is free of charge and lasts for 1 hour. It is a 1-time class. You will need to download the Webex app either on your phone or computer. We will send you a link the night before or the morning of the class. You should be able to click on that link to join the class. This is not a confidential class. You don't have to turn your camera on, but other participants may be able to see your email address.  Scar massage You can begin gentle scar massage to you incision sites. Gently place one hand on the incision and move the skin (without sliding on the skin) in various directions. Do this for a few minutes and then you can gently massage either coconut oil or vitamin E cream into the scars.  Compression garment You should continue wearing your compression bra until you feel like you no longer have swelling.  Home exercise Program Continue doing the exercises you were given until you feel like you can do them without feeling any tightness at the end.   Walking Program Studies show that 30 minutes of walking per day (fast enough to elevate your heart rate) can significantly reduce the risk of a cancer recurrence. If you can't walk due to other medical reasons, we encourage you to find another activity you could do (like a stationary bike or water exercise).  Posture After breast  cancer surgery, people frequently sit with rounded shoulders posture because it puts their incisions on slack and feels better. If you sit like this and scar tissue forms in that position, you can become very tight and have pain sitting or standing with good posture. Try to be aware of your posture and sit and stand up tall to heal properly.  Follow up PT: It is recommended you return every 3 months for the first 3 years following surgery to be assessed  on the SOZO machine for an L-Dex score. This helps prevent clinically significant lymphedema in 95% of patients. These follow up screens are 10 minute appointments that you are not billed for.  Claris Pong, PT 09/18/2022, 1:04 PM

## 2022-09-18 ENCOUNTER — Ambulatory Visit: Payer: Medicare Other | Attending: General Surgery

## 2022-09-18 DIAGNOSIS — R293 Abnormal posture: Secondary | ICD-10-CM | POA: Diagnosis present

## 2022-09-18 DIAGNOSIS — C50412 Malignant neoplasm of upper-outer quadrant of left female breast: Secondary | ICD-10-CM | POA: Insufficient documentation

## 2022-09-18 DIAGNOSIS — Z170 Estrogen receptor positive status [ER+]: Secondary | ICD-10-CM | POA: Diagnosis present

## 2022-09-18 NOTE — Patient Instructions (Signed)
     [facility]  [address], [address]  [place] [address]  [phone]  After Breast Cancer Class It is recommended you attend the ABC class to be educated on lymphedema risk reduction. This class is free of charge and lasts for 1 hour. It is a 1-time class. You will need to download the Webex app either on your phone or computer. We will send you a link the night before or the morning of the class. You should be able to click on that link to join the class. This is not a confidential class. You don't have to turn your camera on, but other participants may be able to see your email address.  Scar massage You can begin gentle scar massage to you incision sites. Gently place one hand on the incision and move the skin (without sliding on the skin) in various directions. Do this for a few minutes and then you can gently massage either coconut oil or vitamin E cream into the scars.  Compression garment You should continue wearing your compression bra until you feel like you no longer have swelling.  Home exercise Program Continue doing the exercises you were given until you feel like you can do them without feeling any tightness at the end.   Walking Program Studies show that 30 minutes of walking per day (fast enough to elevate your heart rate) can significantly reduce the risk of a cancer recurrence. If you can't walk due to other medical reasons, we encourage you to find another activity you could do (like a stationary bike or water exercise).  Posture After breast cancer surgery, people frequently sit with rounded shoulders posture because it puts their incisions on slack and feels better. If you sit like this and scar tissue forms in that position, you can become very tight and have pain sitting or standing with good posture. Try to be aware of your posture and sit and stand up tall to heal properly.  Follow up PT: It is recommended you return every 3 months for  the first 2 years following surgery to be assessed on the SOZO machine for an L-Dex score. This helps prevent clinically significant lymphedema in 95% of patients. These follow up screens are 10 minute appointments that you are not billed for.  

## 2022-09-23 ENCOUNTER — Other Ambulatory Visit: Payer: Self-pay

## 2022-09-23 ENCOUNTER — Ambulatory Visit: Payer: Medicare Other | Attending: Radiation Oncology | Admitting: Radiation Oncology

## 2022-09-23 ENCOUNTER — Encounter: Payer: Self-pay | Admitting: *Deleted

## 2022-09-23 DIAGNOSIS — C50412 Malignant neoplasm of upper-outer quadrant of left female breast: Secondary | ICD-10-CM | POA: Insufficient documentation

## 2022-09-29 DIAGNOSIS — C50412 Malignant neoplasm of upper-outer quadrant of left female breast: Secondary | ICD-10-CM | POA: Diagnosis not present

## 2022-10-01 ENCOUNTER — Other Ambulatory Visit: Payer: Self-pay

## 2022-10-01 ENCOUNTER — Ambulatory Visit
Admission: RE | Admit: 2022-10-01 | Discharge: 2022-10-01 | Disposition: A | Discharge status: Home / Self Care | Payer: Medicare Other | Source: Ambulatory Visit | Attending: Radiation Oncology | Admitting: Radiation Oncology

## 2022-10-01 DIAGNOSIS — Z510 Encounter for antineoplastic radiation therapy: Secondary | ICD-10-CM | POA: Diagnosis not present

## 2022-10-01 DIAGNOSIS — C50412 Malignant neoplasm of upper-outer quadrant of left female breast: Secondary | ICD-10-CM | POA: Diagnosis not present

## 2022-10-01 DIAGNOSIS — Z170 Estrogen receptor positive status [ER+]: Secondary | ICD-10-CM | POA: Insufficient documentation

## 2022-10-01 LAB — RAD ONC ARIA SESSION SUMMARY
Course Elapsed Days: 0
Plan Fractions Treated to Date: 1
Plan Prescribed Dose Per Fraction: 2.67 Gy
Plan Total Fractions Prescribed: 15
Plan Total Prescribed Dose: 40.05 Gy
Reference Point Dosage Given to Date: 2.67 Gy
Reference Point Session Dosage Given: 2.67 Gy
Session Number: 1

## 2022-10-02 ENCOUNTER — Other Ambulatory Visit: Payer: Self-pay

## 2022-10-02 ENCOUNTER — Ambulatory Visit
Admission: RE | Admit: 2022-10-02 | Discharge: 2022-10-02 | Disposition: A | Discharge status: Home / Self Care | Payer: Medicare Other | Source: Ambulatory Visit | Attending: Radiation Oncology | Admitting: Radiation Oncology

## 2022-10-02 DIAGNOSIS — Z510 Encounter for antineoplastic radiation therapy: Secondary | ICD-10-CM | POA: Diagnosis not present

## 2022-10-02 LAB — RAD ONC ARIA SESSION SUMMARY
Course Elapsed Days: 1
Plan Fractions Treated to Date: 2
Plan Prescribed Dose Per Fraction: 2.67 Gy
Plan Total Fractions Prescribed: 15
Plan Total Prescribed Dose: 40.05 Gy
Reference Point Dosage Given to Date: 5.34 Gy
Reference Point Session Dosage Given: 2.67 Gy
Session Number: 2

## 2022-10-03 ENCOUNTER — Other Ambulatory Visit: Payer: Self-pay

## 2022-10-03 ENCOUNTER — Ambulatory Visit
Admission: RE | Admit: 2022-10-03 | Discharge: 2022-10-03 | Disposition: A | Discharge status: Home / Self Care | Payer: Medicare Other | Source: Ambulatory Visit | Attending: Radiation Oncology | Admitting: Radiation Oncology

## 2022-10-03 DIAGNOSIS — Z510 Encounter for antineoplastic radiation therapy: Secondary | ICD-10-CM | POA: Diagnosis not present

## 2022-10-03 LAB — RAD ONC ARIA SESSION SUMMARY
Course Elapsed Days: 2
Plan Fractions Treated to Date: 3
Plan Prescribed Dose Per Fraction: 2.67 Gy
Plan Total Fractions Prescribed: 15
Plan Total Prescribed Dose: 40.05 Gy
Reference Point Dosage Given to Date: 8.01 Gy
Reference Point Session Dosage Given: 2.67 Gy
Session Number: 3

## 2022-10-06 ENCOUNTER — Ambulatory Visit
Admission: RE | Admit: 2022-10-06 | Discharge: 2022-10-06 | Disposition: A | Discharge status: Home / Self Care | Payer: Medicare Other | Source: Ambulatory Visit | Attending: Radiation Oncology | Admitting: Radiation Oncology

## 2022-10-06 ENCOUNTER — Other Ambulatory Visit: Payer: Self-pay

## 2022-10-06 ENCOUNTER — Ambulatory Visit: Payer: Medicare Other

## 2022-10-06 DIAGNOSIS — Z510 Encounter for antineoplastic radiation therapy: Secondary | ICD-10-CM | POA: Diagnosis not present

## 2022-10-06 DIAGNOSIS — C50412 Malignant neoplasm of upper-outer quadrant of left female breast: Secondary | ICD-10-CM

## 2022-10-06 LAB — RAD ONC ARIA SESSION SUMMARY
Course Elapsed Days: 5
Plan Fractions Treated to Date: 4
Plan Prescribed Dose Per Fraction: 2.67 Gy
Plan Total Fractions Prescribed: 15
Plan Total Prescribed Dose: 40.05 Gy
Reference Point Dosage Given to Date: 10.68 Gy
Reference Point Session Dosage Given: 2.67 Gy
Session Number: 4

## 2022-10-06 MED ORDER — RADIAPLEXRX EX GEL: Freq: Once | CUTANEOUS | Status: AC

## 2022-10-06 MED ORDER — ALRA NON-METALLIC DEODORANT (RAD-ONC)
1.0000 | Freq: Once | TOPICAL | Status: AC
  Administered 2022-10-06: 1 via TOPICAL

## 2022-10-07 ENCOUNTER — Ambulatory Visit: Payer: Medicare Other

## 2022-10-08 ENCOUNTER — Ambulatory Visit
Admission: RE | Admit: 2022-10-08 | Discharge: 2022-10-08 | Disposition: A | Discharge status: Home / Self Care | Payer: Medicare Other | Source: Ambulatory Visit | Attending: Radiation Oncology | Admitting: Radiation Oncology

## 2022-10-08 ENCOUNTER — Other Ambulatory Visit: Payer: Self-pay

## 2022-10-08 DIAGNOSIS — Z510 Encounter for antineoplastic radiation therapy: Secondary | ICD-10-CM | POA: Diagnosis not present

## 2022-10-08 LAB — RAD ONC ARIA SESSION SUMMARY
Course Elapsed Days: 7
Plan Fractions Treated to Date: 5
Plan Prescribed Dose Per Fraction: 2.67 Gy
Plan Total Fractions Prescribed: 15
Plan Total Prescribed Dose: 40.05 Gy
Reference Point Dosage Given to Date: 13.35 Gy
Reference Point Session Dosage Given: 2.67 Gy
Session Number: 5

## 2022-10-09 ENCOUNTER — Ambulatory Visit
Admission: RE | Admit: 2022-10-09 | Discharge: 2022-10-09 | Disposition: A | Discharge status: Home / Self Care | Payer: Medicare Other | Source: Ambulatory Visit | Attending: Radiation Oncology | Admitting: Radiation Oncology

## 2022-10-09 ENCOUNTER — Other Ambulatory Visit: Payer: Self-pay

## 2022-10-09 DIAGNOSIS — Z510 Encounter for antineoplastic radiation therapy: Secondary | ICD-10-CM | POA: Diagnosis not present

## 2022-10-09 LAB — RAD ONC ARIA SESSION SUMMARY
Course Elapsed Days: 8
Plan Fractions Treated to Date: 6
Plan Prescribed Dose Per Fraction: 2.67 Gy
Plan Total Fractions Prescribed: 15
Plan Total Prescribed Dose: 40.05 Gy
Reference Point Dosage Given to Date: 16.02 Gy
Reference Point Session Dosage Given: 2.67 Gy
Session Number: 6

## 2022-10-10 ENCOUNTER — Other Ambulatory Visit: Payer: Self-pay

## 2022-10-10 ENCOUNTER — Ambulatory Visit
Admission: RE | Admit: 2022-10-10 | Discharge: 2022-10-10 | Disposition: A | Discharge status: Home / Self Care | Payer: Medicare Other | Source: Ambulatory Visit | Attending: Radiation Oncology | Admitting: Radiation Oncology

## 2022-10-10 DIAGNOSIS — Z510 Encounter for antineoplastic radiation therapy: Secondary | ICD-10-CM | POA: Diagnosis not present

## 2022-10-10 LAB — RAD ONC ARIA SESSION SUMMARY
Course Elapsed Days: 9
Plan Fractions Treated to Date: 7
Plan Prescribed Dose Per Fraction: 2.67 Gy
Plan Total Fractions Prescribed: 15
Plan Total Prescribed Dose: 40.05 Gy
Reference Point Dosage Given to Date: 18.69 Gy
Reference Point Session Dosage Given: 2.67 Gy
Session Number: 7

## 2022-10-13 ENCOUNTER — Other Ambulatory Visit: Payer: Self-pay

## 2022-10-13 ENCOUNTER — Ambulatory Visit
Admission: RE | Admit: 2022-10-13 | Discharge: 2022-10-13 | Disposition: A | Discharge status: Home / Self Care | Payer: Medicare Other | Source: Ambulatory Visit | Attending: Radiation Oncology | Admitting: Radiation Oncology

## 2022-10-13 DIAGNOSIS — Z170 Estrogen receptor positive status [ER+]: Secondary | ICD-10-CM

## 2022-10-13 DIAGNOSIS — Z510 Encounter for antineoplastic radiation therapy: Secondary | ICD-10-CM | POA: Diagnosis not present

## 2022-10-13 LAB — RAD ONC ARIA SESSION SUMMARY
Course Elapsed Days: 12
Plan Fractions Treated to Date: 8
Plan Prescribed Dose Per Fraction: 2.67 Gy
Plan Total Fractions Prescribed: 15
Plan Total Prescribed Dose: 40.05 Gy
Reference Point Dosage Given to Date: 21.36 Gy
Reference Point Session Dosage Given: 2.67 Gy
Session Number: 8

## 2022-10-13 MED ORDER — SONAFINE EX EMUL
1.0000 | Freq: Two times a day (BID) | CUTANEOUS | Status: DC
  Administered 2022-10-13: 1 via TOPICAL

## 2022-10-14 ENCOUNTER — Other Ambulatory Visit: Payer: Self-pay

## 2022-10-14 ENCOUNTER — Ambulatory Visit
Admission: RE | Admit: 2022-10-14 | Discharge: 2022-10-14 | Disposition: A | Discharge status: Home / Self Care | Payer: Medicare Other | Source: Ambulatory Visit | Attending: Radiation Oncology | Admitting: Radiation Oncology

## 2022-10-14 DIAGNOSIS — Z510 Encounter for antineoplastic radiation therapy: Secondary | ICD-10-CM | POA: Diagnosis not present

## 2022-10-14 LAB — RAD ONC ARIA SESSION SUMMARY
Course Elapsed Days: 13
Plan Fractions Treated to Date: 9
Plan Prescribed Dose Per Fraction: 2.67 Gy
Plan Total Fractions Prescribed: 15
Plan Total Prescribed Dose: 40.05 Gy
Reference Point Dosage Given to Date: 24.03 Gy
Reference Point Session Dosage Given: 2.67 Gy
Session Number: 9

## 2022-10-15 ENCOUNTER — Ambulatory Visit
Admission: RE | Admit: 2022-10-15 | Discharge: 2022-10-15 | Disposition: A | Discharge status: Home / Self Care | Payer: Medicare Other | Source: Ambulatory Visit | Attending: Radiation Oncology | Admitting: Radiation Oncology

## 2022-10-15 ENCOUNTER — Other Ambulatory Visit: Payer: Self-pay

## 2022-10-15 DIAGNOSIS — Z510 Encounter for antineoplastic radiation therapy: Secondary | ICD-10-CM | POA: Diagnosis not present

## 2022-10-15 LAB — RAD ONC ARIA SESSION SUMMARY
Course Elapsed Days: 14
Plan Fractions Treated to Date: 10
Plan Prescribed Dose Per Fraction: 2.67 Gy
Plan Total Fractions Prescribed: 15
Plan Total Prescribed Dose: 40.05 Gy
Reference Point Dosage Given to Date: 26.7 Gy
Reference Point Session Dosage Given: 2.67 Gy
Session Number: 10

## 2022-10-16 ENCOUNTER — Other Ambulatory Visit: Payer: Self-pay

## 2022-10-16 ENCOUNTER — Ambulatory Visit
Admission: RE | Admit: 2022-10-16 | Discharge: 2022-10-16 | Disposition: A | Discharge status: Home / Self Care | Payer: Medicare Other | Source: Ambulatory Visit | Attending: Radiation Oncology | Admitting: Radiation Oncology

## 2022-10-16 DIAGNOSIS — Z510 Encounter for antineoplastic radiation therapy: Secondary | ICD-10-CM | POA: Diagnosis not present

## 2022-10-16 LAB — RAD ONC ARIA SESSION SUMMARY
Course Elapsed Days: 15
Plan Fractions Treated to Date: 11
Plan Prescribed Dose Per Fraction: 2.67 Gy
Plan Total Fractions Prescribed: 15
Plan Total Prescribed Dose: 40.05 Gy
Reference Point Dosage Given to Date: 29.37 Gy
Reference Point Session Dosage Given: 2.67 Gy
Session Number: 11

## 2022-10-17 ENCOUNTER — Ambulatory Visit
Admission: RE | Admit: 2022-10-17 | Discharge: 2022-10-17 | Disposition: A | Discharge status: Home / Self Care | Payer: Medicare Other | Source: Ambulatory Visit | Attending: Radiation Oncology | Admitting: Radiation Oncology

## 2022-10-17 ENCOUNTER — Other Ambulatory Visit: Payer: Self-pay

## 2022-10-17 DIAGNOSIS — Z510 Encounter for antineoplastic radiation therapy: Secondary | ICD-10-CM | POA: Diagnosis not present

## 2022-10-17 LAB — RAD ONC ARIA SESSION SUMMARY
Course Elapsed Days: 16
Plan Fractions Treated to Date: 12
Plan Prescribed Dose Per Fraction: 2.67 Gy
Plan Total Fractions Prescribed: 15
Plan Total Prescribed Dose: 40.05 Gy
Reference Point Dosage Given to Date: 32.04 Gy
Reference Point Session Dosage Given: 2.67 Gy
Session Number: 12

## 2022-10-19 ENCOUNTER — Ambulatory Visit
Admission: RE | Admit: 2022-10-19 | Discharge: 2022-10-19 | Disposition: A | Discharge status: Home / Self Care | Payer: Medicare Other | Source: Ambulatory Visit | Attending: Radiation Oncology | Admitting: Radiation Oncology

## 2022-10-19 ENCOUNTER — Other Ambulatory Visit: Payer: Self-pay

## 2022-10-19 DIAGNOSIS — Z510 Encounter for antineoplastic radiation therapy: Secondary | ICD-10-CM | POA: Diagnosis not present

## 2022-10-19 LAB — RAD ONC ARIA SESSION SUMMARY
Course Elapsed Days: 18
Plan Fractions Treated to Date: 13
Plan Prescribed Dose Per Fraction: 2.67 Gy
Plan Total Fractions Prescribed: 15
Plan Total Prescribed Dose: 40.05 Gy
Reference Point Dosage Given to Date: 34.71 Gy
Reference Point Session Dosage Given: 2.67 Gy
Session Number: 13

## 2022-10-20 ENCOUNTER — Ambulatory Visit: Payer: Medicare Other

## 2022-10-20 ENCOUNTER — Other Ambulatory Visit: Payer: Self-pay

## 2022-10-20 ENCOUNTER — Ambulatory Visit
Admission: RE | Admit: 2022-10-20 | Discharge: 2022-10-20 | Disposition: A | Discharge status: Home / Self Care | Payer: Medicare Other | Source: Ambulatory Visit | Attending: Radiation Oncology | Admitting: Radiation Oncology

## 2022-10-20 DIAGNOSIS — Z510 Encounter for antineoplastic radiation therapy: Secondary | ICD-10-CM | POA: Diagnosis not present

## 2022-10-20 LAB — RAD ONC ARIA SESSION SUMMARY
Course Elapsed Days: 19
Plan Fractions Treated to Date: 14
Plan Prescribed Dose Per Fraction: 2.67 Gy
Plan Total Fractions Prescribed: 15
Plan Total Prescribed Dose: 40.05 Gy
Reference Point Dosage Given to Date: 37.38 Gy
Reference Point Session Dosage Given: 2.67 Gy
Session Number: 14

## 2022-10-21 ENCOUNTER — Ambulatory Visit
Admission: RE | Admit: 2022-10-21 | Discharge: 2022-10-21 | Disposition: A | Discharge status: Home / Self Care | Payer: Medicare Other | Source: Ambulatory Visit | Attending: Radiation Oncology | Admitting: Radiation Oncology

## 2022-10-21 ENCOUNTER — Other Ambulatory Visit: Payer: Self-pay

## 2022-10-21 DIAGNOSIS — Z510 Encounter for antineoplastic radiation therapy: Secondary | ICD-10-CM | POA: Diagnosis not present

## 2022-10-21 LAB — RAD ONC ARIA SESSION SUMMARY
Course Elapsed Days: 20
Plan Fractions Treated to Date: 15
Plan Prescribed Dose Per Fraction: 2.67 Gy
Plan Total Fractions Prescribed: 15
Plan Total Prescribed Dose: 40.05 Gy
Reference Point Dosage Given to Date: 40.05 Gy
Reference Point Session Dosage Given: 2.67 Gy
Session Number: 15

## 2022-10-22 ENCOUNTER — Other Ambulatory Visit: Payer: Self-pay

## 2022-10-22 ENCOUNTER — Ambulatory Visit: Payer: Medicare Other

## 2022-10-22 ENCOUNTER — Ambulatory Visit
Admission: RE | Admit: 2022-10-22 | Discharge: 2022-10-22 | Disposition: A | Discharge status: Home / Self Care | Payer: Medicare Other | Source: Ambulatory Visit | Attending: Radiation Oncology | Admitting: Radiation Oncology

## 2022-10-22 DIAGNOSIS — Z510 Encounter for antineoplastic radiation therapy: Secondary | ICD-10-CM | POA: Diagnosis not present

## 2022-10-22 LAB — RAD ONC ARIA SESSION SUMMARY
Course Elapsed Days: 21
Plan Fractions Treated to Date: 1
Plan Prescribed Dose Per Fraction: 2 Gy
Plan Total Fractions Prescribed: 5
Plan Total Prescribed Dose: 10 Gy
Reference Point Dosage Given to Date: 2 Gy
Reference Point Session Dosage Given: 2 Gy
Session Number: 16

## 2022-10-27 ENCOUNTER — Other Ambulatory Visit: Payer: Self-pay

## 2022-10-27 ENCOUNTER — Ambulatory Visit: Payer: Medicare Other

## 2022-10-27 ENCOUNTER — Ambulatory Visit
Admission: RE | Admit: 2022-10-27 | Discharge: 2022-10-27 | Disposition: A | Discharge status: Home / Self Care | Payer: Medicare Other | Source: Ambulatory Visit | Attending: Radiation Oncology | Admitting: Radiation Oncology

## 2022-10-27 DIAGNOSIS — Z170 Estrogen receptor positive status [ER+]: Secondary | ICD-10-CM

## 2022-10-27 DIAGNOSIS — Z510 Encounter for antineoplastic radiation therapy: Secondary | ICD-10-CM | POA: Diagnosis not present

## 2022-10-27 LAB — RAD ONC ARIA SESSION SUMMARY
Course Elapsed Days: 26
Plan Fractions Treated to Date: 2
Plan Prescribed Dose Per Fraction: 2 Gy
Plan Total Fractions Prescribed: 5
Plan Total Prescribed Dose: 10 Gy
Reference Point Dosage Given to Date: 4 Gy
Reference Point Session Dosage Given: 2 Gy
Session Number: 17

## 2022-10-27 MED ORDER — RADIAPLEXRX EX GEL: Freq: Once | CUTANEOUS | Status: AC

## 2022-10-28 ENCOUNTER — Other Ambulatory Visit: Payer: Self-pay

## 2022-10-28 ENCOUNTER — Ambulatory Visit
Admission: RE | Admit: 2022-10-28 | Discharge: 2022-10-28 | Disposition: A | Discharge status: Home / Self Care | Payer: Medicare Other | Source: Ambulatory Visit | Attending: Radiation Oncology | Admitting: Radiation Oncology

## 2022-10-28 DIAGNOSIS — Z510 Encounter for antineoplastic radiation therapy: Secondary | ICD-10-CM | POA: Diagnosis not present

## 2022-10-28 LAB — RAD ONC ARIA SESSION SUMMARY
Course Elapsed Days: 27
Plan Fractions Treated to Date: 3
Plan Prescribed Dose Per Fraction: 2 Gy
Plan Total Fractions Prescribed: 5
Plan Total Prescribed Dose: 10 Gy
Reference Point Dosage Given to Date: 6 Gy
Reference Point Session Dosage Given: 2 Gy
Session Number: 18

## 2022-10-28 NOTE — Progress Notes (Signed)
Patient Care Team: Debbrah Alar, NP as PCP - General (Internal Medicine) Stark Klein, MD as Consulting Physician (General Surgery) [name], MD as Consulting Physician (Hematology and Oncology) Gery Pray, MD as Consulting Physician (Radiation Oncology) Rockwell Germany, RN as Oncology Nurse Navigator Mauro Kaufmann, RN as Oncology Nurse Navigator  DIAGNOSIS: No diagnosis found.  SUMMARY OF ONCOLOGIC HISTORY: Oncology History  Malignant neoplasm of upper-outer quadrant of left breast in female, estrogen receptor positive ([place])  07/01/2022 Initial Diagnosis   Screening mammogram detected left breast mass at 2 o'clock position 1 cm in size.  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  With DCIS grade 2, margins negative, 0/3 lymph nodes negative ER 95%, PR 20%, HER2 1+ negative, Ki-67 5%   09/16/2022 Cancer Staging   Staging form: Breast, AJCC 8th Edition - Pathologic stage from 09/16/2022: Stage IA (pT1c, pN0, cM0, G2, ER+, PR+, HER2-) - Signed by Eppie Gibson, MD on 09/16/2022 Histologic grading system: 3 grade system     CHIEF COMPLIANT: Follow-up left breast cancer   INTERVAL HISTORY: Chloe Reid is a 66 y.o with the above-mentioned  left breast cancer. She presents to the clinic for a follow-up to discus the Oncotype.    ALLERGIES:  is allergic to lisinopril.  MEDICATIONS:  Current Outpatient Medications  Medication Sig Dispense Refill   acetaminophen (TYLENOL) 500 MG tablet Take 1 tablet (500 mg total) by mouth every 6 (six) hours as  needed. 30 tablet 0   atorvastatin (LIPITOR) 20 MG tablet Take 1 tablet (20 mg total) by mouth daily. 90 tablet 1   calcium-vitamin D (OSCAL WITH D) 500-200 MG-UNIT tablet Take 1 tablet by mouth 3 (three) times daily. 90 tablet 12   doxycycline (MONODOX) 100 MG capsule Take 1 capsule by mouth 2 (two) times daily.     gabapentin (NEURONTIN) 100 MG capsule Take 100 mg by mouth 3 (three) times daily.     methocarbamol (ROBAXIN) 500 MG tablet Take by mouth.     oxyCODONE (OXY IR/ROXICODONE) 5 MG immediate release tablet Take 1 tablet (5 mg total) by mouth every 6 (six) hours as needed for severe pain. 5 tablet 0   prednisoLONE acetate (PRED FORTE) 1 % ophthalmic suspension Place 1 drop into the left eye 4 times daily. Start after you arrive home from surgery.     valsartan (DIOVAN) 40 MG tablet Take 0.5 tablets (20 mg total) by mouth daily. 90 tablet 3   Zoster Vaccine Adjuvanted [facility]) injection Inject 0.5 mcg IM now and again in 2-6 months. 0.5 mL 1   No current facility-administered medications for this visit.    PHYSICAL EXAMINATION: ECOG PERFORMANCE STATUS: {CHL ONC ECOG [phone]}  There were no vitals filed for this visit. There were no vitals filed for this visit.  BREAST:*** No palpable masses or nodules in either right or left breasts. No palpable axillary supraclavicular or infraclavicular adenopathy no breast tenderness or nipple discharge. (exam performed in the presence of a chaperone)  LABORATORY DATA:  I have reviewed the data as listed    Latest Ref  Rng & Units 08/27/2022   10:20 AM 08/12/2022    5:32 PM 07/09/2022    1:00 PM  CMP  Glucose 70 - 99 mg/dL 107  125  96   BUN 6 - 23 mg/dL _0 Creatinine 0.40 - 1.20 mg/dL 0.70  0.71  0.66   Sodium 135 - 145 mEq/L 137  137  138   Potassium 3.5 - 5.1 mEq/L 4.4  4.1  4.0   Chloride 96 - 112 mEq/L 103  102  105   CO2 19 - 32 mEq/L _1 Calcium 8.4 - 10.5 mg/dL 9.4  9.4  9.4   Total Protein 6.5 - 8.1 g/dL    8.1   Total Bilirubin 0.3 - 1.2 mg/dL   0.4   Alkaline Phos 38 - 126 U/L   59   AST 15 - 41 U/L   16   ALT 0 - 44 U/L   16     Lab Results  Component Value Date   WBC 9.5 07/09/2022   HGB 12.0 07/09/2022   HCT 36.5 07/09/2022   MCV 77.3 (L) 07/09/2022   PLT 272 07/09/2022   NEUTROABS 5.6 07/09/2022    ASSESSMENT & PLAN:  No problem-specific Assessment & Plan notes found for this encounter.    No orders of the defined types were placed in this encounter.  The patient has a good understanding of the overall plan. she agrees with it. she will call with any problems that may develop before the next visit here. Total time spent: 30 mins including face to face time and time spent for planning, charting and co-ordination of care   Suzzette Righter, [place] 10/28/22    I Gardiner Coins am scribing for Dr. Lindi Adie  ***

## 2022-10-29 ENCOUNTER — Other Ambulatory Visit: Payer: Self-pay

## 2022-10-29 ENCOUNTER — Ambulatory Visit
Admission: RE | Admit: 2022-10-29 | Discharge: 2022-10-29 | Disposition: A | Discharge status: Home / Self Care | Payer: Medicare Other | Source: Ambulatory Visit | Attending: Radiation Oncology | Admitting: Radiation Oncology

## 2022-10-29 DIAGNOSIS — Z510 Encounter for antineoplastic radiation therapy: Secondary | ICD-10-CM | POA: Diagnosis not present

## 2022-10-29 LAB — RAD ONC ARIA SESSION SUMMARY
Course Elapsed Days: 28
Plan Fractions Treated to Date: 4
Plan Prescribed Dose Per Fraction: 2 Gy
Plan Total Fractions Prescribed: 5
Plan Total Prescribed Dose: 10 Gy
Reference Point Dosage Given to Date: 8 Gy
Reference Point Session Dosage Given: 2 Gy
Session Number: 19

## 2022-10-30 ENCOUNTER — Other Ambulatory Visit: Payer: Self-pay

## 2022-10-30 ENCOUNTER — Ambulatory Visit: Payer: Medicare Other

## 2022-10-30 ENCOUNTER — Inpatient Hospital Stay: Payer: Medicare Other | Attending: Hematology and Oncology | Admitting: Hematology and Oncology

## 2022-10-30 ENCOUNTER — Encounter: Payer: Self-pay | Admitting: *Deleted

## 2022-10-30 ENCOUNTER — Ambulatory Visit
Admission: RE | Admit: 2022-10-30 | Discharge: 2022-10-30 | Disposition: A | Discharge status: Home / Self Care | Payer: Medicare Other | Source: Ambulatory Visit | Attending: Radiation Oncology | Admitting: Radiation Oncology

## 2022-10-30 VITALS — BP 149/70 | HR 73 | Temp 97.7°F | Resp 18 | Ht 63.0 in | Wt 161.8 lb

## 2022-10-30 DIAGNOSIS — Z170 Estrogen receptor positive status [ER+]: Secondary | ICD-10-CM | POA: Diagnosis not present

## 2022-10-30 DIAGNOSIS — Z780 Asymptomatic menopausal state: Secondary | ICD-10-CM

## 2022-10-30 DIAGNOSIS — Z510 Encounter for antineoplastic radiation therapy: Secondary | ICD-10-CM | POA: Diagnosis not present

## 2022-10-30 DIAGNOSIS — C50412 Malignant neoplasm of upper-outer quadrant of left female breast: Secondary | ICD-10-CM | POA: Diagnosis present

## 2022-10-30 LAB — RAD ONC ARIA SESSION SUMMARY
Course Elapsed Days: 29
Plan Fractions Treated to Date: 5
Plan Prescribed Dose Per Fraction: 2 Gy
Plan Total Fractions Prescribed: 5
Plan Total Prescribed Dose: 10 Gy
Reference Point Dosage Given to Date: 10 Gy
Reference Point Session Dosage Given: 2 Gy
Session Number: 20

## 2022-10-30 MED ORDER — ANASTROZOLE 1 MG PO TABS: 1.0000 mg | ORAL_TABLET | Freq: Every day | ORAL | 3 refills | Status: DC

## 2022-10-30 NOTE — Assessment & Plan Note (Signed)
08/20/2022:Left lumpectomy: Grade 2 IDC 1.8 cm, extensive perineural invasion.  With DCIS grade 2, margins negative, 0/3 lymph nodes negative ER 95%, PR 20%, HER2 1+ negative, Ki-67 5%  Oncotype DX score 20: Risk of recurrence: 6% 10/30/2022: Adjuvant radiation completed  Treatment plan: Adjuvant antiestrogen therapy with anastrozole 1 mg daily x5 to 7 years  Anastrozole counseling: We discussed the risks and benefits of anti-estrogen therapy with aromatase inhibitors. These include but not limited to insomnia, hot flashes, mood changes, vaginal dryness, bone density loss, and weight gain. We strongly believe that the benefits far outweigh the risks. Patient understands these risks and consented to starting treatment. Planned treatment duration is 5-7 years.  Return to clinic in 3 months for survivorship care plan visit

## 2022-10-31 ENCOUNTER — Ambulatory Visit: Payer: Medicare Other

## 2022-10-31 HISTORY — PX: OTHER SURGICAL HISTORY: SHX169

## 2022-11-11 ENCOUNTER — Ambulatory Visit: Payer: Medicare Other | Admitting: Family

## 2022-11-17 ENCOUNTER — Ambulatory Visit: Payer: Medicare Other | Attending: General Surgery

## 2022-11-17 VITALS — Wt 155.2 lb

## 2022-11-17 DIAGNOSIS — Z483 Aftercare following surgery for neoplasm: Secondary | ICD-10-CM | POA: Insufficient documentation

## 2022-11-17 NOTE — Therapy (Signed)
OUTPATIENT PHYSICAL THERAPY SOZO SCREENING NOTE   Patient Name: Chloe Reid MRN: [id number] DOB:[birth year], 66 y.o., female Today's Date: 11/17/2022  PCP: Debbrah Alar, NP REFERRING PROVIDER: Stark Klein, MD   PT End of Session - 11/17/22 1423     Visit Number 2   # unchanged due to screen only   PT Start Time 58    PT Stop Time 1427    PT Time Calculation (min) 4 min    Activity Tolerance Patient tolerated treatment well    Behavior During Therapy WFL for tasks assessed/performed             Past Medical History:  Diagnosis Date   Breast cancer ([place])    left breast IDC with DCIS   Cataracts, bilateral    Glaucoma    Hypertension    Kidney stone    Macular drusen    Migraine    Pre-diabetes    Past Surgical History:  Procedure Laterality Date   BREAST LUMPECTOMY WITH RADIOACTIVE SEED AND SENTINEL LYMPH NODE BIOPSY Left 08/20/2022   Procedure: LEFT BREAST LUMPECTOMY WITH RADIOACTIVE SEED AND SENTINEL LYMPH NODE BIOPSY;  Surgeon: Stark Klein, MD;  Location: [facility];  Service: General;  Laterality: Left;   EYE SURGERY Right    cataract   uterine biopsy     Patient Active Problem List   Diagnosis Date Noted   Malignant neoplasm of upper-outer quadrant of left breast in female, estrogen receptor positive ([place]) 07/07/2022   Microalbuminuria due to type 2 diabetes mellitus ([place]) 05/14/2022   Macular drusen    Kidney stone    Glaucoma    Cataracts, bilateral    Acute pain of left knee 06/05/2017   Onychomycosis 07/09/2014   Chest pain 03/24/2014   Urinary incontinence 03/24/2014   Controlled type 2 diabetes mellitus with microalbuminuria, without long-term current use of insulin ([place]) 03/13/2014   Hyperlipidemia 03/13/2014   Routine general medical examination at a health care facility 03/10/2014   Migraine 03/10/2014    REFERRING DIAG: left breast cancer at risk for lymphedema  THERAPY DIAG:  Aftercare following surgery for  neoplasm  PERTINENT HISTORY: S/p left lumpectomy 08/20/2022: Stage IA (cT1b, cN0, cM0) Left Breast UOQ, 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 Barry Dienes on 09/05/22, the patient complained of burning in her arm and shoulder. Examination by MD showed faint pinkness and mild swelling to the left breast area. A minimal breast seroma was also appreciated, for which Dr. Barry Dienes prescribed her a 1 week course of doxycycline and gabapentin. She  will follow-up with Dr. Barry Dienes on 09/19/2022 this week to assess her progress   PRECAUTIONS: left UE Lymphedema risk, None  SUBJECTIVE: Pt returns for her first 3 month L-Dex screen.   PAIN:  Are you having pain? No   SOZO SCREENING: Patient was assessed today using the SOZO machine to determine the lymphedema index score. This was compared to her baseline score. It was determined that she is within the recommended range when compared to her baseline and no further action is needed at this time. She will continue SOZO screenings. These are done every 3 months for 2 years post operatively followed by every 6 months for 2 years, and then annually.   L-DEX FLOWSHEETS - 11/17/22 1400       L-DEX LYMPHEDEMA SCREENING   Measurement Type Unilateral  L-DEX MEASUREMENT EXTREMITY Upper Extremity    POSITION  Standing    DOMINANT SIDE Right    At Risk Side Left    BASELINE SCORE (UNILATERAL) 2.9    L-DEX SCORE (UNILATERAL) 4.3    VALUE CHANGE (UNILAT) 1.4               Otelia Limes, PTA 11/17/2022, 2:26 PM

## 2022-11-18 ENCOUNTER — Ambulatory Visit (INDEPENDENT_AMBULATORY_CARE_PROVIDER_SITE_OTHER): Payer: Medicare Other | Admitting: Family

## 2022-11-18 ENCOUNTER — Telehealth: Payer: Self-pay

## 2022-11-18 ENCOUNTER — Ambulatory Visit (HOSPITAL_BASED_OUTPATIENT_CLINIC_OR_DEPARTMENT_OTHER)
Admission: RE | Admit: 2022-11-18 | Discharge: 2022-11-18 | Disposition: A | Discharge status: Home / Self Care | Payer: Medicare Other | Source: Ambulatory Visit | Attending: Hematology and Oncology | Admitting: Hematology and Oncology

## 2022-11-18 ENCOUNTER — Encounter: Payer: Self-pay | Admitting: Family

## 2022-11-18 VITALS — BP 130/74 | HR 66 | Temp 97.8°F | Resp 16 | Wt 155.0 lb

## 2022-11-18 DIAGNOSIS — E1129 Type 2 diabetes mellitus with other diabetic kidney complication: Secondary | ICD-10-CM

## 2022-11-18 DIAGNOSIS — E785 Hyperlipidemia, unspecified: Secondary | ICD-10-CM

## 2022-11-18 DIAGNOSIS — R809 Proteinuria, unspecified: Secondary | ICD-10-CM | POA: Diagnosis not present

## 2022-11-18 DIAGNOSIS — Z1211 Encounter for screening for malignant neoplasm of colon: Secondary | ICD-10-CM | POA: Diagnosis not present

## 2022-11-18 DIAGNOSIS — C50412 Malignant neoplasm of upper-outer quadrant of left female breast: Secondary | ICD-10-CM | POA: Diagnosis present

## 2022-11-18 DIAGNOSIS — Z170 Estrogen receptor positive status [ER+]: Secondary | ICD-10-CM

## 2022-11-18 DIAGNOSIS — Z780 Asymptomatic menopausal state: Secondary | ICD-10-CM | POA: Insufficient documentation

## 2022-11-18 DIAGNOSIS — F418 Other specified anxiety disorders: Secondary | ICD-10-CM

## 2022-11-18 MED ORDER — BLOOD GLUCOSE MONITOR KIT: PACK | 0 refills | Status: DC

## 2022-11-18 NOTE — Assessment & Plan Note (Addendum)
Lab Results  Component Value Date   HGBA1C 6.9 (H) 08/12/2022   HGBA1C 6.8 (H) 05/12/2022   HGBA1C 6.8 (H) 11/14/2020   Lab Results  Component Value Date   MICROALBUR 2.1 (H) 05/12/2022   LDLCALC 141 (H) 08/12/2022   CREATININE 0.70 08/27/2022   Last A1C at goal. Continue diabetic diet.

## 2022-11-18 NOTE — Progress Notes (Signed)
Subjective:   By signing my name below, I, Chloe Reid, attest that this documentation has been prepared under the direction and in the presence of Karie Chimera, NP 11/18/2022     Patient ID: Chloe Reid, female    DOB: 03/11/1956, 66 y.o.   MRN: [id number]  Chief Complaint  Patient presents with   Diabetes    Here for follow up    Diabetes Associated symptoms include weakness.  Patient is in today for an office visit. She is accompanied by her daughter.   Refill: She is requesting a refill of 40 mg of Valsartan and 20 mg of Atorvastatin.   Left Sided Pain: Patient presents with left sided abdominal and arm and weakness that occurred after her left breast lumpectomy on 08/20/2022. She has recently finished radiation and her daughter notes that the patients weakness is worsening.   Blood Sugars: She is not regularly checking blood sugars at home.  Lab Results  Component Value Date   HGBA1C 6.9 (H) 08/12/2022   Dexa: Her last completed dexa scan was in 11/18/2022. Her daughter reports that the patient has mild osteopenia.   Kidneys: Since last visit, Valsartan was added to her medication list to prevent kidney damage. She denies of any significant side effects.   Anxiety: She states that when she is in the car, her anxiety increases. Her daughter notes that the patient also becomes stiff. She recounts of an episode when she was in a car and it was foggy which caused her increased anxiety.   Colonoscopy: She is interested in being referred for a colonoscopy next year.   Health Maintenance Due  Topic Date Due   COLONOSCOPY (Pts 45-31yr Insurance coverage will need to be confirmed)  Never done   OPHTHALMOLOGY EXAM  09/09/2015   COVID-19 Vaccine (2 - Moderna risk series) 06/09/2022   Zoster Vaccines- Shingrix (2 of 2) 07/07/2022    Past Medical History:  Diagnosis Date   [place]    left breast IDC with DCIS   Cataracts, bilateral    Glaucoma     Hypertension    Kidney stone    Macular drusen    Migraine    Pre-diabetes     Past Surgical History:  Procedure Laterality Date   BREAST LUMPECTOMY WITH RADIOACTIVE SEED AND SENTINEL LYMPH NODE BIOPSY Left 08/20/2022   Procedure: LEFT BREAST LUMPECTOMY WITH RADIOACTIVE SEED AND SENTINEL LYMPH NODE BIOPSY;  Surgeon: BStark Klein MD;  [place] [facility]  Service: General;  Laterality: Left;   EYE SURGERY Right    cataract   uterine biopsy      Family History  Problem Relation Age of Onset   Heart disease Mother    Heart disease Father    Kidney disease Father     Social History   Socioeconomic History   Marital status: Married    Spouse name: Not on file   Number of children: Not on file   Years of education: Not on file   Highest education level: Not on file  Occupational History   Not on file  Tobacco Use   Smoking status: Never   Smokeless tobacco: Never  Vaping Use   Vaping Use: Never used  Substance and Sexual Activity   Alcohol use: No   Drug use: Never   Sexual activity: Not Currently    Birth control/protection: Post-menopausal  Other Topics Concern   Not on file  Social History Narrative   Has 4 children-  3 daughter's 1 son, all live at home   Married   Cooks at [employer]   Enjoys television, cooking   No pets   [place] in [place]- moved here in [place] Strain: Not on [employer] Insecurity: Not on file  Transportation Needs: Not on file  Physical Activity: Not on file  Stress: Not on file  Social Connections: Not on file  Intimate Partner Violence: Not on file    Outpatient Medications Prior to Visit  Medication Sig Dispense Refill   acetaminophen (TYLENOL) 500 MG tablet Take 1 tablet (500 mg total) by mouth every 6 (six) hours as needed. 30 tablet 0   anastrozole (ARIMIDEX) 1 MG tablet Take 1 tablet (1 mg total) by mouth daily. 90 tablet 3   atorvastatin (LIPITOR) 20 MG  tablet Take 1 tablet (20 mg total) by mouth daily. 90 tablet 1   calcium-vitamin D (OSCAL WITH D) 500-200 MG-UNIT tablet Take 1 tablet by mouth 3 (three) times daily. 90 tablet 12   prednisoLONE acetate (PRED FORTE) 1 % ophthalmic suspension Place 1 drop into the left eye 4 times daily. Start after you arrive home from surgery.     valsartan (DIOVAN) 40 MG tablet Take 0.5 tablets (20 mg total) by mouth daily. 90 tablet 3   Zoster Vaccine Adjuvanted [facility]) injection Inject 0.5 mcg IM now and again in 2-6 months. 0.5 mL 1   gabapentin (NEURONTIN) 100 MG capsule Take 100 mg by mouth 3 (three) times daily. (Patient not taking: Reported on 11/18/2022)     methocarbamol (ROBAXIN) 500 MG tablet Take by mouth.     No facility-administered medications prior to visit.    Allergies  Allergen Reactions   Lisinopril     Dizziness    Review of Systems  Musculoskeletal:  Positive for myalgias (Left Sided Arm and Abdomen).  Neurological:  Positive for weakness.       Objective:    Physical Exam Constitutional:      General: She is not in acute distress.    Appearance: Normal appearance. She is not ill-appearing.  HENT:     Head: Normocephalic and atraumatic.     Right Ear: External ear normal.     Left Ear: External ear normal.  Eyes:     Extraocular Movements: Extraocular movements intact.     Pupils: Pupils are equal, round, and reactive to light.  Cardiovascular:     Rate and Rhythm: Normal rate and regular rhythm.     Heart sounds: Normal heart sounds. No murmur heard.    No gallop.  Pulmonary:     Effort: Pulmonary effort is normal. No respiratory distress.     Breath sounds: Normal breath sounds. No wheezing or rales.  Skin:    General: Skin is warm and dry.  Neurological:     Mental Status: She is alert and oriented to person, place, and time.  Psychiatric:        Mood and Affect: Mood normal.        Behavior: Behavior normal.        Judgment: Judgment normal.     BP  130/74 (BP Location: Right Arm, Patient Position: Sitting, Cuff Size: Small)   Pulse 66   Temp 97.8 F (36.6 C) (Oral)   Resp 16   Wt 155 lb (70.3 kg)   LMP 12/02/2003   SpO2 99%   BMI 27.46 kg/m  Wt Readings from Last  3 Encounters:  11/18/22 155 lb (70.3 kg)  11/17/22 155 lb 4 oz (70.4 kg)  10/30/22 161 lb 12.8 oz (73.4 kg)       Assessment & Plan:   Problem List Items Addressed This Visit       Unprioritized   Situational anxiety    Only occurs when she is riding in a car.  Advised daughter that I would not recommend a daily medication nor would I recommend a sedative for this.  We discussed that she could look into finding a therapist who speaks Urdu to help if she would like.       Microalbuminuria due to type 2 diabetes mellitus ([place])    Tolerating ARB without side effects.  Continue low dose diovan.       Malignant neoplasm of upper-outer quadrant of left breast in female, estrogen receptor positive ([place])    Pt is s/p lumpectomy/radiation and lymph node dissection. She has some associated discomfort at the lumpectomy site and some left arm swelling/discomfort at times.  Reassurance provided. Recommended that she elevated her left arm as needed.       Hyperlipidemia    Lab Results  Component Value Date   CHOL 231 (H) 08/12/2022   HDL 54.90 08/12/2022   LDLCALC 141 (H) 08/12/2022   TRIG 176.0 (H) 08/12/2022   CHOLHDL 4 08/12/2022  On statin, repeat lipid panel.       Relevant Orders   Lipid panel   Controlled type 2 diabetes mellitus with microalbuminuria, without long-term current use of insulin ([place]) - Primary    Lab Results  Component Value Date   HGBA1C 6.9 (H) 08/12/2022   HGBA1C 6.8 (H) 05/12/2022   HGBA1C 6.8 (H) 11/14/2020   Lab Results  Component Value Date   MICROALBUR 2.1 (H) 05/12/2022   LDLCALC 141 (H) 08/12/2022   CREATININE 0.70 08/27/2022  Last A1C at goal. Continue diabetic diet.       Relevant Medications   blood glucose meter kit  and supplies KIT   Other Relevant Orders   HgB A1c   Comp Met (CMET)   Other Visit Diagnoses     Screening for colon cancer       Relevant Orders   Ambulatory referral to Gastroenterology      Meds ordered this encounter  Medications   blood glucose meter kit and supplies KIT    Sig: Dispense based on patient and insurance preference. Use up to four times daily as directed.    Dispense:  1 each    Refill:  0    Order Specific Question:   Supervising Provider    Answer:   Penni Homans A [id number]    Order Specific Question:   Number of strips    Answer:   100    Order Specific Question:   Number of lancets    Answer:   100    I, Nance Pear, NP, personally preformed the services described in this documentation.  All medical record entries made by the scribe were at my direction and in my presence.  I have reviewed the chart and discharge instructions (if applicable) and agree that the record reflects my personal performance and is accurate and complete. 11/18/2022   I,Amber Collins,acting as a scribe for Nance Pear, NP.,have documented all relevant documentation on the behalf of Nance Pear, NP,as directed by  Nance Pear, NP while in the presence of Nance Pear, NP.    Melissa S  Inda Castle, NP

## 2022-11-18 NOTE — Assessment & Plan Note (Signed)
Only occurs when she is riding in a car.  Advised daughter that I would not recommend a daily medication nor would I recommend a sedative for this.  We discussed that she could look into finding a therapist who speaks Urdu to help if she would like.

## 2022-11-18 NOTE — Assessment & Plan Note (Signed)
Pt is s/p lumpectomy/radiation and lymph node dissection. She has some associated discomfort at the lumpectomy site and some left arm swelling/discomfort at times.  Reassurance provided. Recommended that she elevated her left arm as needed.

## 2022-11-18 NOTE — Assessment & Plan Note (Signed)
Tolerating ARB without side effects.  Continue low dose diovan.

## 2022-11-18 NOTE — Telephone Encounter (Signed)
Called Pt's daughter and given below message. Memoona verbalized understanding and stated she would relay information to Pt. Encouraged daughter to call back with further questions.

## 2022-11-18 NOTE — Telephone Encounter (Signed)
-----   Message from Gardenia Phlegm, NP sent at 11/18/2022  2:30 PM EST ----- Mild ostepenia recommend calcium, vitamin d and weight bearing exercises ----- Message ----- From: Interface, Rad Results In Sent: 11/18/2022  11:44 AM EST To: [name], MD

## 2022-11-18 NOTE — Assessment & Plan Note (Signed)
Lab Results  Component Value Date   CHOL 231 (H) 08/12/2022   HDL 54.90 08/12/2022   LDLCALC 141 (H) 08/12/2022   TRIG 176.0 (H) 08/12/2022   CHOLHDL 4 08/12/2022   On statin, repeat lipid panel.

## 2022-11-19 LAB — LIPID PANEL
Cholesterol: 178 mg/dL (ref 0–200)
HDL: 59.6 mg/dL (ref >39.00)
LDL Cholesterol: 96 mg/dL (ref 0–99)
NonHDL: 118.11
Total CHOL/HDL Ratio: 3
Triglycerides: 110 mg/dL (ref 0.0–149.0)
VLDL: 22 mg/dL (ref 0.0–40.0)

## 2022-11-19 LAB — COMPREHENSIVE METABOLIC PANEL
ALT: 14 U/L (ref 0–35)
AST: 16 U/L (ref 0–37)
Albumin: 4 g/dL (ref 3.5–5.2)
Alkaline Phosphatase: 65 U/L (ref 39–117)
BUN: 20 mg/dL (ref 6–23)
CO2: 26 mEq/L (ref 19–32)
Calcium: 9.4 mg/dL (ref 8.4–10.5)
Chloride: 102 mEq/L (ref 96–112)
Creatinine, Ser: 0.72 mg/dL (ref 0.40–1.20)
GFR: 85.84 mL/min (ref >60.00)
Glucose, Bld: 157 mg/dL — ABNORMAL HIGH (ref 70–99)
Potassium: 4 mEq/L (ref 3.5–5.1)
Sodium: 138 mEq/L (ref 135–145)
Total Bilirubin: 0.4 mg/dL (ref 0.2–1.2)
Total Protein: 7.2 g/dL (ref 6.0–8.3)

## 2022-11-19 LAB — HEMOGLOBIN A1C: Hgb A1c MFr Bld: 7 % — ABNORMAL HIGH (ref 4.6–6.5)

## 2022-11-27 ENCOUNTER — Ambulatory Visit
Admission: RE | Admit: 2022-11-27 | Discharge: 2022-11-27 | Disposition: A | Discharge status: Home / Self Care | Payer: Medicare Other | Source: Ambulatory Visit | Attending: Radiation Oncology | Admitting: Radiation Oncology

## 2022-11-27 ENCOUNTER — Telehealth: Payer: Self-pay

## 2022-11-27 NOTE — Telephone Encounter (Signed)
I called the patient today about her upcoming follow-up appointment in radiation oncology.   Given the state of the COVID-19 pandemic, concerning case numbers in our community, and guidance from [facility], I offered a phone assessment with the patient to determine if coming to the clinic was necessary. She accepted.  The patient denies any symptomatic concerns.  Specifically, they report good healing of their skin in the radiation fields.  Skin is intact and normal.  She did report mild swelling and continued sharp pains. Her daughter reports she is taking tylenol for pain as she does not like to take a lot of medications. She also reported that the area remains very tender as well. The pt reported through her daughter that she also remained fatigued though this was slowly improving. She stated she remained weak and tired overall at baseline. Pt did report that she had no physical therapy sessions remaining and was concerned about this. Based on the conversation today Rn feels like extending these sessions will help. Rn will check into this for pt.   I recommended that she continue skin care by applying oil or lotion with vitamin E to the skin in the radiation fields, BID, for 2 more months.  Continue follow-up with medical oncology - follow-up is scheduled on 01-29-23 with Wilber Bihari.  I explained that yearly mammograms are important for patients with intact breast tissue, and physical exams are important after mastectomy for patients that cannot undergo mammography.  I encouraged her to call if she had further questions or concerns about her healing. Otherwise, she will follow-up PRN in radiation oncology. Patient is pleased with this plan, and we will cancel her upcoming follow-up to reduce the risk of COVID-19 transmission.

## 2022-12-05 NOTE — Radiation Completion Notes (Signed)
Patient Name: Chloe Reid, Chloe Reid MRN: [id number] Date of Birth: 17-Jun-1956 Referring Physician: [name], M.D. Date of Service: 2022-12-05 Radiation Oncologist: Eppie Gibson, M.D. [place]                             Radiation Oncology End of Treatment Note     Diagnosis: C50.412 Malignant neoplasm of upper-outer quadrant of left female breast Staging on 2022-09-16: Malignant neoplasm of upper-outer quadrant of left breast in female, estrogen receptor positive ([place]) T=pT1c, N=pN0, M=cM0 Staging on 2022-07-09: Malignant neoplasm of upper-outer quadrant of left breast in female, estrogen receptor positive ([place]) T=cT1b, N=cN0, M=cM0 Intent: Curative     ==========DELIVERED PLANS==========  First Treatment Date: 2022-10-01 - Last Treatment Date: 2022-10-30   Plan Name: Breast_L Site: Breast, Left Technique: 3D Mode: Photon Dose Per Fraction: 2.67 Gy Prescribed Dose (Delivered / Prescribed): 40.05 Gy / 40.05 Gy Prescribed Fxs (Delivered / Prescribed): 15 / 15   Plan Name: Breast_L_Bst Site: Breast, Left Technique: Electron Mode: Electron Dose Per Fraction: 2 Gy Prescribed Dose (Delivered / Prescribed): 10 Gy / 10 Gy Prescribed Fxs (Delivered / Prescribed): 5 / 5     ==========ON TREATMENT VISIT DATES========== 2022-10-06, 2022-10-13, 2022-10-20, 2022-10-27     ==========UPCOMING VISITS==========       ==========APPENDIX - ON TREATMENT VISIT NOTES==========   PatEd 2022-10-06 Ongoing education performed.   ImpPlan 2022-10-06 The patient is tolerating radiation. Continue treatment as planned.   PhysExam 2022-10-06 Alert, no acute distress.   ProgNote 2022-10-06 Changes from last week/visit? [ Yes, pain is improving ] Pain? [ Yes, mild, hard spot at scar ] Fatigue? [ Yes, moderate  ] Skin irritation? [ No ] Using lotions? [ No ] Lymphedema? [ No ] Issues with ROM? [ Yes ] Need refills: [ No ] Additional  Weekly Progress Notes [ none other  than hard spot at scar line ]    RunningNotes 2022-10-06 10-06-22 education done   PatEd 2022-10-13 Ongoing education performed.   ImpPlan 2022-10-13 The patient is tolerating radiation. Continue treatment as planned.   PhysExam 2022-10-13 Alert, no acute distress.   ProgNote 2022-10-13 Changes from last week/visit? [ Yes, pain is improving but still remains ] Pain? [ Yes, constant mild under arm but intermittent at breast area ] Fatigue? [ Yes, mild fatigue remains ] Skin irritation? [ No ] Using lotions? [ Yes, radioplex might be irritating skin ] Lymphedema? [ No ] Issues with ROM? [ No ] Need refills: [ No ] Additional  Weekly Progress Notes [ no other concerns at this time.  ]    PatEd 2022-10-20 Ongoing education performed.   ImpPlan 2022-10-20 The patient is tolerating radiation. Continue treatment as planned.   PhysExam 2022-10-20 Alert, no acute distress.   ProgNote 2022-10-20 Changes from last week/visit? [ No ] Pain? [ Yes, back of left arm/breast ] Fatigue? [ Yes, moderate ] Skin irritation? [ No ] Using lotions? [ Yes ] Lymphedema? [ No ] Issues with ROM? [ No ] Need refills: [ No ] Additional  Weekly Progress Notes [ sonifine working better. skin has improved.  ]    PatEd 2022-10-27 Ongoing education performed.   ImpPlan 2022-10-27 The patient is tolerating radiation. Continue treatment as planned.   PhysExam 2022-10-27 Alert, no acute distress.   ProgNote 2022-10-27 Changes from last week/visit? [ Yes ] Pain? [ Yes- to left breast. Patient rates pain 4/10 ] Fatigue? [ No ] Skin  irritation? [ Yes- hyperpigmented.  ] Using lotions? [ Yes ] Lymphedema? [ No ] Issues with ROM? [ No ] Need refills: [ Yes- [facility] ] Additional  Weekly Progress Notes [ Patient reports tenderness and swelling to left breast.  ]

## 2022-12-23 ENCOUNTER — Telehealth: Payer: Self-pay

## 2022-12-23 NOTE — Telephone Encounter (Signed)
Called pt. In response to an old note in chart that may not have been answered. Advised pt to call if she feels she requires further PT. She is scheduled for her [facility] screens.

## 2023-01-09 LAB — HM DIABETES EYE EXAM

## 2023-01-29 ENCOUNTER — Inpatient Hospital Stay: Payer: Medicare Other | Admitting: Adult Health

## 2023-01-31 ENCOUNTER — Other Ambulatory Visit: Payer: Self-pay | Admitting: Family

## 2023-02-06 ENCOUNTER — Ambulatory Visit: Payer: Medicare Other | Admitting: Family

## 2023-02-09 ENCOUNTER — Ambulatory Visit: Payer: Medicare Other | Admitting: Family

## 2023-02-10 ENCOUNTER — Ambulatory Visit (INDEPENDENT_AMBULATORY_CARE_PROVIDER_SITE_OTHER): Payer: Medicare Other | Admitting: Family

## 2023-02-10 ENCOUNTER — Encounter: Payer: Self-pay | Admitting: Family

## 2023-02-10 ENCOUNTER — Telehealth: Payer: Self-pay | Admitting: Family

## 2023-02-10 VITALS — BP 121/70 | HR 68 | Temp 97.6°F | Resp 16 | Wt 157.0 lb

## 2023-02-10 DIAGNOSIS — E1129 Type 2 diabetes mellitus with other diabetic kidney complication: Secondary | ICD-10-CM | POA: Diagnosis not present

## 2023-02-10 DIAGNOSIS — E785 Hyperlipidemia, unspecified: Secondary | ICD-10-CM

## 2023-02-10 DIAGNOSIS — C50412 Malignant neoplasm of upper-outer quadrant of left female breast: Secondary | ICD-10-CM | POA: Diagnosis not present

## 2023-02-10 DIAGNOSIS — R809 Proteinuria, unspecified: Secondary | ICD-10-CM

## 2023-02-10 DIAGNOSIS — Z1211 Encounter for screening for malignant neoplasm of colon: Secondary | ICD-10-CM | POA: Diagnosis not present

## 2023-02-10 DIAGNOSIS — Z170 Estrogen receptor positive status [ER+]: Secondary | ICD-10-CM

## 2023-02-10 MED ORDER — BLOOD GLUCOSE TEST VI STRP: 1.0000 | ORAL_STRIP | Freq: Three times a day (TID) | 0 refills | Status: AC

## 2023-02-10 MED ORDER — BLOOD GLUCOSE MONITORING SUPPL DEVI: 1.0000 | Freq: Three times a day (TID) | 0 refills | Status: DC

## 2023-02-10 MED ORDER — LANCETS MISC. MISC: 1.0000 | Freq: Three times a day (TID) | 0 refills | Status: AC

## 2023-02-10 MED ORDER — LANCET DEVICE MISC: 1.0000 | Freq: Three times a day (TID) | 0 refills | Status: AC

## 2023-02-10 NOTE — Assessment & Plan Note (Signed)
Repeat A1C.  Reinforced DM diet. She does not have a glucometer. Will send one to her pharmacy.

## 2023-02-10 NOTE — Assessment & Plan Note (Signed)
She reports some chronic left arm pain following lumpectomy/lymph node removal. Recommended tylenol 1000 mg bid prn, ROM exercises left arm. If symptoms persist recommended that she follow back up with oncology to see about PT recommendations.

## 2023-02-10 NOTE — Progress Notes (Signed)
Subjective:   By signing my name below, I, Chloe Reid, attest that this documentation has been prepared under the direction and in the presence of Chloe Alar, NP. 02/10/2023.    Patient ID: Chloe Reid, female    DOB: October 16, 1956, 67 y.o.   MRN: [id number]  Chief Complaint  Patient presents with   Diabetes    Here for follow up    HPI Patient is in today for an office visit. She is accompanied by her daughter who is the primary historian and assists with interpretation.  S/P Lumpectomy:  The only pain she is experiencing now is subsequent to her recent left breast lumpectomy on 08/20/2022. The pain is localized to flare-ups in her left arm and left neck where the lymph nodes were removed.  Nerve pain:  Was previously taking gabapentin for her left arm nerve pain. Currently she is not using gabapentin as this pain has resolved.  Diet: Her daughter reports that she has been trying to follow a healthy diet but not always consistently. She does not monitor her blood sugars at home since she doesn't have a glucose monitor. Lab Results  Component Value Date   HGBA1C 7.0 (H) 11/18/2022   Shingrix: They report that she has received 1 of 2 shingrix vaccinations so far.  Vision:  Since her last visit she underwent bilateral cataract surgery. She now follows with Dr. Deatra Ina at [facility].  Past Medical History:  Diagnosis Date   Breast cancer ([place])    left breast IDC with DCIS   Cataracts, bilateral    Glaucoma    Hypertension    Kidney stone    Macular drusen    Migraine    Pre-diabetes     Past Surgical History:  Procedure Laterality Date   BREAST LUMPECTOMY WITH RADIOACTIVE SEED AND SENTINEL LYMPH NODE BIOPSY Left 08/20/2022   Procedure: LEFT BREAST LUMPECTOMY WITH RADIOACTIVE SEED AND SENTINEL LYMPH NODE BIOPSY;  Surgeon: Stark Klein, MD;  Location: [facility];  Service: General;  Laterality: Left;   cataract left Left 10/2022   EYE SURGERY Right     cataract   uterine biopsy      Family History  Problem Relation Age of Onset   Heart disease Mother    Heart disease Father    Kidney disease Father     Social History   Socioeconomic History   Marital status: Married    Spouse name: Not on file   Number of children: Not on file   Years of education: Not on file   Highest education level: Not on file  Occupational History   Not on file  Tobacco Use   Smoking status: Never   Smokeless tobacco: Never  Vaping Use   Vaping Use: Never used  Substance and Sexual Activity   Alcohol use: No   Drug use: Never   Sexual activity: Not Currently    Birth control/protection: Post-menopausal  Other Topics Concern   Not on file  Social History Narrative   Has 4 children- 3 daughter's 1 son, all live at home   Married   Cooks at [employer]   Enjoys television, cooking   No pets   Grew up in [place]- moved here in [place] Determinants of Health   Financial Resource Strain: Not on [employer] Insecurity: Not on file  Transportation Needs: Not on file  Physical Activity: Not on file  Stress: Not on file  Social Connections: Not on file  Intimate Partner Violence: Not on file    Outpatient Medications Prior to Visit  Medication Sig Dispense Refill   acetaminophen (TYLENOL) 500 MG tablet Take 1 tablet (500 mg total) by mouth every 6 (six) hours as needed. 30 tablet 0   anastrozole (ARIMIDEX) 1 MG tablet Take 1 tablet (1 mg total) by mouth daily. 90 tablet 3   atorvastatin (LIPITOR) 20 MG tablet Take 1 tablet by mouth once daily 90 tablet 1   blood glucose meter kit and supplies KIT Dispense based on patient and insurance preference. Use up to four times daily as directed. 1 each 0   calcium-vitamin D (OSCAL WITH D) 500-200 MG-UNIT tablet Take 1 tablet by mouth 3 (three) times daily. 90 tablet 12   valsartan (DIOVAN) 40 MG tablet Take 0.5 tablets (20 mg total) by mouth daily. 90 tablet 3   gabapentin (NEURONTIN) 100 MG  capsule Take 100 mg by mouth 3 (three) times daily. (Patient not taking: Reported on 11/18/2022)     prednisoLONE acetate (PRED FORTE) 1 % ophthalmic suspension Place 1 drop into the left eye 4 times daily. Start after you arrive home from surgery.     Zoster Vaccine Adjuvanted [facility]) injection Inject 0.5 mcg IM now and again in 2-6 months. 0.5 mL 1   No facility-administered medications prior to visit.    Allergies  Allergen Reactions   Lisinopril     Dizziness    Review of Systems  Musculoskeletal:  Positive for myalgias (Left arm) and neck pain (Left).       Objective:    Physical Exam Constitutional:      Appearance: Normal appearance.  HENT:     Head: Normocephalic and atraumatic.     Right Ear: Tympanic membrane, ear canal and external ear normal.     Left Ear: Tympanic membrane, ear canal and external ear normal.  Eyes:     Extraocular Movements: Extraocular movements intact.     Pupils: Pupils are equal, round, and reactive to light.  Cardiovascular:     Rate and Rhythm: Normal rate and regular rhythm.     Heart sounds: Normal heart sounds. No murmur heard.    No gallop.  Pulmonary:     Effort: Pulmonary effort is normal. No respiratory distress.     Breath sounds: Normal breath sounds. No wheezing or rales.  Skin:    General: Skin is warm and dry.  Neurological:     General: No focal deficit present.     Mental Status: She is alert and oriented to person, place, and time.  Psychiatric:        Mood and Affect: Mood normal.        Behavior: Behavior normal.     BP 121/70 (BP Location: Right Arm, Patient Position: Sitting, Cuff Size: Small)   Pulse 68   Temp 97.6 F (36.4 C) (Oral)   Resp 16   Wt 157 lb (71.2 kg)   LMP 12/02/2003   SpO2 100%   BMI 27.81 kg/m  Wt Readings from Last 3 Encounters:  02/10/23 157 lb (71.2 kg)  11/18/22 155 lb (70.3 kg)  11/17/22 155 lb 4 oz (70.4 kg)       Assessment & Plan:   Problem List Items Addressed This  Visit       Unprioritized   Microalbuminuria due to type 2 diabetes mellitus ([place])    BP Readings from Last 3 Encounters:  02/10/23 121/70  11/18/22 130/74  10/30/22 (!) 149/70  Continues  ARB.       Malignant neoplasm of upper-outer quadrant of left breast in female, estrogen receptor positive ([place])    She reports some chronic left arm pain following lumpectomy/lymph node removal. Recommended tylenol 1000 mg bid prn, ROM exercises left arm. If symptoms persist recommended that she follow back up with oncology to see about PT recommendations.      Hyperlipidemia    Lab Results  Component Value Date   CHOL 178 11/18/2022   HDL 59.60 11/18/2022   LDLCALC 96 11/18/2022   TRIG 110.0 11/18/2022   CHOLHDL 3 11/18/2022  Last lipid panel at goal. Continue atorvastatin.      Controlled type 2 diabetes mellitus with microalbuminuria, without long-term current use of insulin ([facility]) - Primary    Repeat A1C.  Reinforced DM diet. She does not have a glucometer. Will send one to her pharmacy.       Relevant Orders   HgB A1c   Comp Met (CMET)   Other Visit Diagnoses     Screening for colon cancer       Relevant Orders   Ambulatory referral to Gastroenterology     Encouraged her to get shingrix #2 and covid booster at her pharmacy.   Meds ordered this encounter  Medications   Blood Glucose Monitoring Suppl DEVI    Sig: 1 each by Does not apply route in the morning, at noon, and at bedtime. May substitute to any manufacturer covered by patient's insurance.    Dispense:  1 each    Refill:  0    Order Specific Question:   Supervising Provider    Answer:   Penni Homans A [[id number]]   Glucose Blood (BLOOD GLUCOSE TEST STRIPS) STRP    Sig: 1 each by In Vitro route in the morning, at noon, and at bedtime. May substitute to any manufacturer covered by patient's insurance.    Dispense:  100 strip    Refill:  0    Order Specific Question:   Supervising Provider    Answer:   Penni Homans A  [[id number]]   Lancet Device MISC    Sig: 1 each by Does not apply route in the morning, at noon, and at bedtime. May substitute to any manufacturer covered by patient's insurance.    Dispense:  1 each    Refill:  0    Order Specific Question:   Supervising Provider    Answer:   Penni Homans A [[id number]]   Lancets Misc. MISC    Sig: 1 each by Does not apply route in the morning, at noon, and at bedtime. May substitute to any manufacturer covered by patient's insurance.    Dispense:  100 each    Refill:  0    Order Specific Question:   Supervising Provider    Answer:   Penni Homans A [[id number]]    I, Nance Pear, NP, personally preformed the services described in this documentation.  All medical record entries made by the scribe were at my direction and in my presence.  I have reviewed the chart and discharge instructions (if applicable) and agree that the record reflects my personal performance and is accurate and complete. 02/10/2023.  I,Mathew Stumpf,acting as a Education administrator for [employer], NP.,have documented all relevant documentation on the behalf of Nance Pear, NP,as directed by  Nance Pear, NP while in the presence of Nance Pear, NP.   Nance Pear, NP

## 2023-02-10 NOTE — Telephone Encounter (Signed)
Opened in error

## 2023-02-10 NOTE — Assessment & Plan Note (Signed)
Lab Results  Component Value Date   CHOL 178 11/18/2022   HDL 59.60 11/18/2022   LDLCALC 96 11/18/2022   TRIG 110.0 11/18/2022   CHOLHDL 3 11/18/2022   Last lipid panel at goal. Continue atorvastatin.

## 2023-02-10 NOTE — Assessment & Plan Note (Addendum)
BP Readings from Last 3 Encounters:  02/10/23 121/70  11/18/22 130/74  10/30/22 (!) 149/70   Continues ARB.

## 2023-02-16 ENCOUNTER — Inpatient Hospital Stay: Payer: Medicare Other | Attending: Adult Health | Admitting: Adult Health

## 2023-02-16 ENCOUNTER — Encounter: Payer: Self-pay | Admitting: Adult Health

## 2023-02-16 ENCOUNTER — Other Ambulatory Visit: Payer: Self-pay

## 2023-02-16 ENCOUNTER — Telehealth: Payer: Self-pay | Admitting: Adult Health

## 2023-02-16 VITALS — BP 134/75 | HR 60 | Temp 97.7°F | Resp 14 | Ht 63.0 in | Wt 156.6 lb

## 2023-02-16 DIAGNOSIS — M8588 Other specified disorders of bone density and structure, other site: Secondary | ICD-10-CM | POA: Diagnosis not present

## 2023-02-16 DIAGNOSIS — C50412 Malignant neoplasm of upper-outer quadrant of left female breast: Secondary | ICD-10-CM | POA: Insufficient documentation

## 2023-02-16 DIAGNOSIS — Z79811 Long term (current) use of aromatase inhibitors: Secondary | ICD-10-CM | POA: Diagnosis not present

## 2023-02-16 DIAGNOSIS — Z79899 Other long term (current) drug therapy: Secondary | ICD-10-CM | POA: Insufficient documentation

## 2023-02-16 DIAGNOSIS — Z170 Estrogen receptor positive status [ER+]: Secondary | ICD-10-CM

## 2023-02-16 NOTE — Progress Notes (Signed)
SURVIVORSHIP VISIT:   BRIEF ONCOLOGIC HISTORY:  Oncology History  Malignant neoplasm of upper-outer quadrant of left breast in female, estrogen receptor positive ([place])  07/01/2022 Initial Diagnosis   Screening mammogram detected left breast mass at 2 o'clock position 1 cm in size.  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  With DCIS grade 2, margins negative, 0/3 lymph nodes negative ER 95%, PR 20%, HER2 1+ negative, Ki-67 5%   09/16/2022 Cancer Staging   Staging form: Breast, AJCC 8th Edition - Pathologic stage from 09/16/2022: Stage IA (pT1c, pN0, cM0, G2, ER+, PR+, HER2-) - Signed by Eppie Gibson, MD on 09/16/2022 Histologic grading system: 3 grade system   10/02/2022 - 10/20/2022 Radiation Therapy   Left Breast: 40.05 Gy in 15 treatments "Boost": 10 Gy in 5 treatments    10/2022 -  Anti-estrogen oral therapy   Anastrozole x 5-7 years     INTERVAL HISTORY:  Chloe Reid to review her survivorship care plan detailing her treatment course for breast cancer, as well as monitoring long-term side effects of that treatment, education regarding health maintenance, screening, and overall wellness and health promotion.     Overall, Chloe Reid reports feeling quite well.  She is taking anastrozole daily and has intermittent hot flashes but otherwise is tolerating it well. She also reports  intermittent sharp pain at left breast.  Pain occurs 3-4 times per day with no specific pattern.  She says it is brief and then resolves.   She is fatigued and weak since surgery.  She has  intermittent hot flashes.  She is practicing energy conservation.  She occasionally gets woken up by hot flashes.  This improves with drinking water and then she is able to go back to sleep.  She snores at night and has daytime sleepiness as well.    Fatigue leading to DOE, hot flashes at night can make this worse too.    REVIEW OF SYSTEMS:  Review of Systems  Constitutional:  Positive for fatigue. Negative for appetite change, chills, fever and unexpected weight change.  HENT:   Negative for hearing loss, lump/mass and trouble swallowing.   Eyes:  Negative for eye problems and icterus.  Respiratory:  Negative for chest tightness, cough and shortness of breath.   Cardiovascular:  Negative for chest pain, leg swelling and palpitations.  Gastrointestinal:  Negative for abdominal distention, abdominal pain, constipation, diarrhea, nausea and vomiting.  Endocrine: Negative for hot flashes.  Genitourinary:  Negative for difficulty urinating.   Musculoskeletal:  Negative for arthralgias.  Skin:  Negative for itching and rash.  Neurological:  Negative for dizziness, extremity weakness, headaches and numbness.  Hematological:  Negative for adenopathy. Does not bruise/bleed easily.  Psychiatric/Behavioral:  Negative for depression. The patient is not nervous/anxious.   Breast: Denies any new nodularity, masses, tenderness, nipple changes, or nipple discharge.      PAST MEDICAL/SURGICAL HISTORY:  Past Medical History:  Diagnosis Date   Breast cancer ([place])    left breast IDC with DCIS   Cataracts, bilateral    Glaucoma    Hypertension  Kidney stone    Macular drusen    Migraine    Pre-diabetes    Past Surgical History:  Procedure Laterality Date   BREAST LUMPECTOMY WITH RADIOACTIVE SEED AND SENTINEL LYMPH NODE BIOPSY Left 08/20/2022   Procedure: LEFT BREAST LUMPECTOMY WITH RADIOACTIVE SEED AND SENTINEL LYMPH NODE BIOPSY;  Surgeon: Stark Klein, MD;  Location: [facility];  Service: General;  Laterality: Left;   cataract left Left 10/2022   EYE SURGERY Right    cataract   uterine biopsy       ALLERGIES:  Allergies  Allergen Reactions   Lisinopril     Dizziness     CURRENT MEDICATIONS:  Outpatient Encounter Medications as of 02/16/2023  Medication Sig   acetaminophen (TYLENOL) 500 MG tablet Take 1 tablet (500 mg total) by mouth every 6 (six) hours as needed.   anastrozole (ARIMIDEX) 1 MG tablet Take 1 tablet (1 mg total) by mouth daily.   atorvastatin (LIPITOR) 20 MG tablet Take 1 tablet by mouth once daily   blood glucose meter kit and supplies KIT Dispense based on patient and insurance preference. Use up to four times daily as directed.   Blood Glucose Monitoring Suppl DEVI 1 each by Does not apply route in the morning, at noon, and at bedtime. May substitute to any manufacturer covered by patient's insurance.   calcium-vitamin D (OSCAL WITH D) 500-200 MG-UNIT tablet Take 1 tablet by mouth 3 (three) times daily.   Glucose Blood (BLOOD GLUCOSE TEST STRIPS) STRP 1 each by In Vitro route in the morning, at noon, and at bedtime. May substitute to any manufacturer covered by patient's insurance.   Lancet Device MISC 1 each by Does not apply route in the morning, at noon, and at bedtime. May substitute to any manufacturer covered by patient's insurance.   Lancets Misc. MISC 1 each by Does not apply route in the morning, at noon, and at bedtime. May substitute to any manufacturer covered by patient's insurance.   valsartan (DIOVAN) 40 MG tablet Take 0.5 tablets (20 mg total) by mouth daily.   No facility-administered encounter medications on file as of 02/16/2023.     ONCOLOGIC FAMILY HISTORY:  Family History  Problem Relation Age of Onset   Heart disease Mother    Heart disease Father    Kidney disease Father     SOCIAL HISTORY:  Social History   Socioeconomic History   Marital status: Married    Spouse name: Not on file   Number of  children: Not on file   Years of education: Not on file   Highest education level: Not on file  Occupational History   Not on file  Tobacco Use   Smoking status: Never   Smokeless tobacco: Never  Vaping Use   Vaping Use: Never used  Substance and Sexual Activity   Alcohol use: No   Drug use: Never   Sexual activity: Not Currently    Birth control/protection: Post-menopausal  Other Topics Concern   Not on file  Social History Narrative   Has 4 children- 3 daughter's 1 son, all live at home   Married   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[employer]   Enjoys television, cooking   No pets   Grew up in [place]- moved here in [place] Determinants of Health   Financial Resource Strain: Not on [employer] Insecurity: Not on file  Transportation Needs: Not on file  Physical Activity: Not on file  Stress: Not on file  Social Connections: Not on file  Intimate Partner Violence: Not on file     OBSERVATIONS/OBJECTIVE:  BP 134/75 (BP Location: Right Arm, Patient Position: Sitting)   Pulse 60   Temp 97.7 F (36.5 C) (Temporal)   Resp 14   Ht 5\' 3"  (1.6 m)   Wt 156 lb 9.6 oz (71 kg)   LMP 12/02/2003   SpO2 100%   BMI 27.74 kg/m  GENERAL: Patient is a well appearing female in no acute distress HEENT:  Sclerae anicteric.  Oropharynx clear and moist. No ulcerations or evidence of oropharyngeal candidiasis. Neck is supple.  NODES:  No cervical, supraclavicular, or axillary lymphadenopathy palpated.  BREAST EXAM:  left breast s/p lumpectomy and radiation, no sign of local recurrence, right breast benign.  LUNGS:  Clear to auscultation bilaterally.  No wheezes or rhonchi. HEART:  Regular rate and rhythm. No murmur appreciated. ABDOMEN:  Soft, nontender.  Positive, normoactive bowel sounds. No organomegaly palpated. MSK:  No focal spinal tenderness to palpation. Full range of motion bilaterally in the upper extremities. EXTREMITIES:  No peripheral edema.   SKIN:  Clear with no obvious rashes or skin  changes. No nail dyscrasia. NEURO:  Nonfocal. Well oriented.  Appropriate affect.  LABORATORY DATA:  None for this visit.  DIAGNOSTIC IMAGING:  None for this visit.      ASSESSMENT AND PLAN:  Ms.. Reid is a pleasant 67 y.o. female with Stage IA left breast invasive ductal carcinoma, ER+/PR+/HER2-, diagnosed in 07/2022, treated with lumpectomy, adjuvant radiation therapy, and anti-estrogen therapy with Anastrozole beginning in 10/2022.  She presents to the Survivorship Clinic for our initial meeting and routine follow-up post-completion of treatment for breast cancer.    1. Stage IA left breast cancer:  Chloe Reid is continuing to recover from definitive treatment for breast cancer. She will follow-up with her medical oncologist, Dr. Lindi Adie in 6 months with history and physical exam per surveillance protocol.  She will continue her anti-estrogen therapy with Anastrozole. Thus far, she is tolerating the Anastrozole well, with minimal side effects. She was instructed to make Dr. Lindi Adie or myself aware if she begins to experience any worsening side effects of the medication and I could see her back in clinic to help manage those side effects, as needed. Her mammogram is due 07/2023; orders placed today. Today, a comprehensive survivorship care plan and treatment summary was reviewed with the patient today detailing her breast cancer diagnosis, treatment course, potential late/long-term effects of treatment, appropriate follow-up care with recommendations for the future, and patient education resources.  A copy of this summary, along with a letter will be sent to the patient's primary care provider via mail/fax/In Basket message after today's visit.    2. Breast pain: This is likely neuropathic pain from her surgery.  I let her know that a lot of times this gets better with time.  I offered a trial of gabapentin however she declined at this point.  3.  Snoring at night: I let her know that the fatigue and  snoring at night could be related to undiagnosed sleep apnea that could have been pre-existing before her cancer surgery and diagnosis.  I recommended that she follow-up with Debbrah Alar her primary care provider if it continues as she may benefit from a sleep study.  4. Bone health:  Given Chloe Reid's age/history of breast cancer and her current treatment regimen including anti-estrogen therapy with Anastrozole, she is at risk for bone demineralization.  Her last DEXA scan was  11/18/2022, which showed osteopenia with a t score of -1.7 in the AP spine.  She was recommended to optimize her calcium and vitamin d intake along with repeat testing in 2 years--10/2024.  She was given education on specific activities to promote bone health.  5. Cancer screening:  Due to Chloe Reid's history and her age, she should receive screening for skin cancers, colon cancer, and gynecologic cancers.  The information and recommendations are listed on the patient's comprehensive care plan/treatment summary and were reviewed in detail with the patient.    6. Health maintenance and wellness promotion: Chloe Reid was encouraged to consume 5-7 servings of fruits and vegetables per day. We reviewed the "Nutrition Rainbow" handout.  She was also encouraged to engage in moderate to vigorous exercise for 30 minutes per day most days of the week. She was instructed to limit her alcohol consumption and continue to abstain from tobacco use.     7. Support services/counseling: It is not uncommon for this period of the patient's cancer care trajectory to be one of many emotions and stressors. She was given information regarding our available services and encouraged to contact me with any questions or for help enrolling in any of our support group/programs.    Follow up instructions:    -Return to cancer center in 6 months for f/u with Dr. Lindi Adie  -Mammogram due in 07/2023 -Bone density testing due 10/2024 -She is welcome to return  back to the Survivorship Clinic at any time; no additional follow-up needed at this time.  -Consider referral back to survivorship as a long-term survivor for continued surveillance  The patient was provided an opportunity to ask questions and all were answered. The patient agreed with the plan and demonstrated an understanding of the instructions.   Total encounter time:45 minutes*in face-to-face visit time, chart review, lab review, care coordination, order entry, and documentation of the encounter time.    Wilber Bihari, NP 02/16/23 11:25 AM Medical Oncology and Hematology [facility] [place], [place] [address] Tel. [phone]    Fax. [phone]  *Total Encounter Time as defined by the Centers for Medicare and Medicaid Services includes, in addition to the face-to-face time of a patient visit (documented in the note above) non-face-to-face time: obtaining and reviewing outside history, ordering and reviewing medications, tests or procedures, care coordination (communications with other health care professionals or caregivers) and documentation in the medical record.

## 2023-02-16 NOTE — Telephone Encounter (Signed)
Scheduled appointment per 3/18 los. Left voicemail.

## 2023-02-17 ENCOUNTER — Other Ambulatory Visit: Payer: Medicare Other

## 2023-02-23 ENCOUNTER — Ambulatory Visit: Payer: Medicare Other | Attending: General Surgery

## 2023-02-23 DIAGNOSIS — Z483 Aftercare following surgery for neoplasm: Secondary | ICD-10-CM | POA: Insufficient documentation

## 2023-05-06 ENCOUNTER — Telehealth: Payer: Self-pay | Admitting: Family

## 2023-05-06 NOTE — Telephone Encounter (Signed)
Copied from CRM [id number]. Topic: Medicare AWV >> May 06, 2023 10:16 AM Payton Doughty wrote: Reason for CRM: LM 05/06/2023 to schedule AWV   Verlee Rossetti; Care Guide Ambulatory Clinical Support [facility] Direct Dial: [phone]

## 2023-06-12 ENCOUNTER — Ambulatory Visit: Payer: Medicare Other | Admitting: Family

## 2023-07-07 ENCOUNTER — Other Ambulatory Visit: Payer: Self-pay | Admitting: Family

## 2023-08-19 NOTE — Assessment & Plan Note (Deleted)
08/20/2022:Left lumpectomy: Grade 2 IDC 1.8 cm, extensive perineural invasion.  With DCIS grade 2, margins negative, 0/3 lymph nodes negative ER 95%, PR 20%, HER2 1+ negative, Ki-67 5%  Oncotype DX score 20: Risk of recurrence: 6% 10/30/2022: Adjuvant radiation completed   Treatment plan: Adjuvant antiestrogen therapy with anastrozole 1 mg daily x5 to 7 years   Anastrozole toxicities: Breast cancer surveillance: Breast exam 08/20/2023: Benign Mammogram to be done this month at [facility] Return to clinic in 1 year for follow-up

## 2023-08-20 ENCOUNTER — Inpatient Hospital Stay: Payer: Medicare Other | Attending: Hematology and Oncology | Admitting: Hematology and Oncology

## 2023-08-20 DIAGNOSIS — C50412 Malignant neoplasm of upper-outer quadrant of left female breast: Secondary | ICD-10-CM

## 2023-10-07 ENCOUNTER — Other Ambulatory Visit: Payer: Self-pay | Admitting: Hematology and Oncology

## 2023-10-12 ENCOUNTER — Telehealth: Payer: Self-pay | Admitting: Hematology and Oncology

## 2023-10-12 NOTE — Telephone Encounter (Signed)
Left patient's daughter a message in regard to scheduled appointment times/dates for follow up appointment per scheduling message

## 2023-10-27 ENCOUNTER — Ambulatory Visit: Payer: Medicare Other | Admitting: Family

## 2023-10-27 VITALS — BP 162/70 | HR 65 | Temp 97.5°F | Resp 16 | Ht 63.0 in | Wt 154.0 lb

## 2023-10-27 DIAGNOSIS — Z23 Encounter for immunization: Secondary | ICD-10-CM | POA: Diagnosis not present

## 2023-10-27 DIAGNOSIS — M542 Cervicalgia: Secondary | ICD-10-CM | POA: Diagnosis not present

## 2023-10-27 DIAGNOSIS — I10 Essential (primary) hypertension: Secondary | ICD-10-CM | POA: Diagnosis not present

## 2023-10-27 NOTE — Progress Notes (Unsigned)
Subjective:     Patient ID: Tayzia Soble, female    DOB: Jul 18, 1956, 67 y.o.   MRN: [id number]  Chief Complaint  Patient presents with   Neck Pain    Complains of pain on left side of neck. Describes as sharp occasional.     HPI  Discussed the use of AI scribe software for clinical note transcription with the patient, who gave verbal consent to proceed.  History of Present Illness   The patient, with a history of radiation treatment, presents with a unique episode of neck discomfort described as 'something gushing upwards' associated with a 'really nasty smell.' The episode occurred suddenly while preparing to travel to [place], and was associated with lightheadedness, diaphoresis, and pallor. The symptoms resolved after approximately 15 minutes with hydration. Since the initial episode, she has had one instance of 'strained' neck pain, but no recurrence of the smell or other associated symptoms. She sought medical attention in [place], where imaging was performed and she was advised to have surgery due to a 'gap in her bones.'          Health Maintenance Due  Topic Date Due   Colonoscopy  Never done   COVID-19 Vaccine (2 - Moderna risk series) 06/09/2022   Zoster Vaccines- Shingrix (2 of 2) 07/07/2022   Diabetic kidney evaluation - Urine ACR  05/13/2023   Medicare Annual Wellness (AWV)  05/13/2023   HEMOGLOBIN A1C  05/20/2023   MAMMOGRAM  06/11/2023   FOOT EXAM  08/13/2023   Diabetic kidney evaluation - eGFR measurement  11/19/2023    Past Medical History:  Diagnosis Date   Breast cancer ([facility])    left breast IDC with DCIS   Cataracts, bilateral    Glaucoma    Hypertension    Kidney stone    Macular drusen    Migraine    Pre-diabetes     Past Surgical History:  Procedure Laterality Date   BREAST LUMPECTOMY WITH RADIOACTIVE SEED AND SENTINEL LYMPH NODE BIOPSY Left 08/20/2022   Procedure: LEFT BREAST LUMPECTOMY WITH RADIOACTIVE SEED AND SENTINEL LYMPH NODE BIOPSY;   Surgeon: Almond Lint, MD;  Location: [facility];  Service: General;  Laterality: Left;   cataract left Left 10/2022   EYE SURGERY Right    cataract   uterine biopsy      Family History  Problem Relation Age of Onset   Heart disease Mother    Heart disease Father    Kidney disease Father     Social History   Socioeconomic History   Marital status: Married    Spouse name: Not on file   Number of children: Not on file   Years of education: Not on file   Highest education level: Not on file  Occupational History   Not on file  Tobacco Use   Smoking status: Never   Smokeless tobacco: Never  Vaping Use   Vaping status: Never Used  Substance and Sexual Activity   Alcohol use: No   Drug use: Never   Sexual activity: Not Currently    Birth control/protection: Post-menopausal  Other Topics Concern   Not on file  Social History Narrative   Has 4 children- 3 daughter's 1 son, all live at home   Married   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 30 tablet 0   anastrozole (ARIMIDEX) 1 MG tablet Take 1 tablet by mouth once daily 90 tablet 0   atorvastatin (LIPITOR) 20 MG tablet Take 1 tablet by mouth once daily 90 tablet 0   blood glucose meter kit and supplies KIT Dispense based on patient and insurance preference. Use up to four times daily as directed. 1 each 0   Blood Glucose Monitoring Suppl DEVI 1 each by Does not apply route in the morning, at noon, and  at bedtime. May substitute to any manufacturer covered by patient's insurance. 1 each 0   calcium-vitamin D (OSCAL WITH D) 500-200 MG-UNIT tablet Take 1 tablet by mouth 3 (three) times daily. 90 tablet 12   valsartan (DIOVAN) 40 MG tablet Take 0.5 tablets (20 mg total) by mouth daily. 90 tablet 3   No facility-administered medications prior to visit.    Allergies  Allergen Reactions   Lisinopril     Dizziness    ROS See HPI    Objective:    Physical Exam Constitutional:      General: She is not in acute distress.    Appearance: Normal appearance. She is well-developed.  HENT:     Head: Normocephalic and atraumatic.     Right Ear: External ear normal.     Left Ear: External ear normal.  Eyes:     General: No scleral icterus. Neck:     Thyroid: No thyromegaly.  Cardiovascular:     Rate and Rhythm: Normal rate and regular rhythm.     Heart sounds: Normal heart sounds. No murmur heard. Pulmonary:     Effort: Pulmonary effort is normal. No respiratory distress.     Breath sounds: Normal breath sounds. No wheezing.  Musculoskeletal:     Cervical back: Neck supple.  Skin:    General: Skin is warm and dry.  Neurological:     Mental Status: She is alert and oriented to person, place, and time.  Psychiatric:        Mood and Affect: Mood normal.        Behavior: Behavior normal.        Thought Content: Thought content normal.        Judgment: Judgment normal.      BP (!) 162/70 (BP Location: Right Arm, Patient Position: Sitting, Cuff Size: Small)   Pulse 65   Temp (!) 97.5 F (36.4 C) (Oral)   Resp 16   Ht 5\' 3"  (1.6 m)   Wt 154 lb (69.9 kg)   LMP 12/02/2003   SpO2 100%   BMI 27.28 kg/m  Wt Readings from Last 3 Encounters:  10/27/23 154 lb (69.9 kg)  02/16/23 156 lb 9.6 oz (71 kg)  02/10/23 157 lb (71.2 kg)       Assessment & Plan:   Problem List Items Addressed This Visit       Unprioritized   Primary hypertension    Uncontrolled. Increase valsartan  from 20mg  to 40mg .       Neck pain - Primary     Sudden onset of severe neck pain with associated olfactory  hallucination and lightheadedness 3 months ago. No recurrence of severe pain, but intermittent mild neck pain. Possible etiologies include cervical radiculopathy or atypical migraine. No recent imaging available for review. -Order cervical spine X-ray. -Advise over-the-counter Motrin as needed for pain.      Relevant Orders   DG Cervical Spine Complete   Other Visit Diagnoses     Needs flu shot       Relevant Orders   Flu Vaccine Trivalent High Dose (Fluad) (Completed)       I am having Meridee Score maintain her acetaminophen, calcium-vitamin D, blood glucose meter kit and supplies, Blood Glucose Monitoring Suppl, atorvastatin, and anastrozole.  No orders of the defined types were placed in this encounter.

## 2023-10-28 ENCOUNTER — Telehealth: Payer: Self-pay | Admitting: Family

## 2023-10-28 DIAGNOSIS — I10 Essential (primary) hypertension: Secondary | ICD-10-CM | POA: Insufficient documentation

## 2023-10-28 DIAGNOSIS — M542 Cervicalgia: Secondary | ICD-10-CM | POA: Insufficient documentation

## 2023-10-28 MED ORDER — VALSARTAN 40 MG PO TABS: 40.0000 mg | ORAL_TABLET | Freq: Every day | ORAL | Status: DC

## 2023-10-28 NOTE — Patient Instructions (Signed)
VISIT SUMMARY:  You came in today because of a sudden episode of neck discomfort that you described as 'something gushing upwards' with a 'really nasty smell.' This episode happened while you were preparing to travel to [place] and was accompanied by lightheadedness, sweating, and paleness. The symptoms went away after about 15 minutes with hydration. Since then, you have had one instance of 'strained' neck pain but no recurrence of the smell or other symptoms. You sought medical attention in [place] and were advised to have surgery due to a 'gap in your bones.'  YOUR PLAN:  -NECK PAIN: Neck pain can be caused by various issues, including problems with the nerves or muscles in the neck. In your case, the doctor suspects it could be cervical radiculopathy (a pinched nerve in the neck) or an atypical migraine. We will order a cervical spine X-ray to get a better look at your neck. In the meantime, you can take over-the-counter Motrin as needed for pain. Please return to the clinic in 2 weeks for a follow-up visit.  -GENERAL HEALTH MAINTENANCE: General health maintenance includes routine check-ups and preventive measures to keep you healthy. Today, you received a high-dose influenza vaccine to help protect you from the flu.  INSTRUCTIONS:  Please return to the clinic in 2 weeks for a follow-up visit. We will review the results of your cervical spine X-ray and discuss your neck pain further.

## 2023-10-28 NOTE — Assessment & Plan Note (Signed)
  Sudden onset of severe neck pain with associated olfactory hallucination and lightheadedness 3 months ago. No recurrence of severe pain, but intermittent mild neck pain. Possible etiologies include cervical radiculopathy or atypical migraine. No recent imaging available for review. -Order cervical spine X-ray. -Advise over-the-counter Motrin as needed for pain.

## 2023-10-28 NOTE — Assessment & Plan Note (Signed)
Uncontrolled. Increase valsartan from 20mg  to 40mg .

## 2023-10-28 NOTE — Telephone Encounter (Addendum)
Blood pressure was elevated at yesterday's visit. She is currently taking a 1/2 tab of valsartan. Please increase to a full tablet once daily and follow up as scheduled in 2 weeks.

## 2023-11-02 NOTE — Telephone Encounter (Signed)
Information given to patient's daughter 

## 2023-11-10 ENCOUNTER — Ambulatory Visit (INDEPENDENT_AMBULATORY_CARE_PROVIDER_SITE_OTHER): Payer: Medicare Other | Admitting: Family

## 2023-11-10 VITALS — BP 138/65 | HR 62 | Temp 97.8°F | Resp 16

## 2023-11-10 DIAGNOSIS — I10 Essential (primary) hypertension: Secondary | ICD-10-CM

## 2023-11-10 DIAGNOSIS — I159 Secondary hypertension, unspecified: Secondary | ICD-10-CM

## 2023-11-10 DIAGNOSIS — M542 Cervicalgia: Secondary | ICD-10-CM | POA: Diagnosis not present

## 2023-11-10 MED ORDER — ATORVASTATIN CALCIUM 20 MG PO TABS: 20.0000 mg | ORAL_TABLET | Freq: Every day | ORAL | 1 refills | Status: DC

## 2023-11-10 MED ORDER — VALSARTAN 40 MG PO TABS: 40.0000 mg | ORAL_TABLET | Freq: Every day | ORAL | 1 refills | Status: DC

## 2023-11-11 LAB — BASIC METABOLIC PANEL
BUN: 21 mg/dL (ref 6–23)
CO2: 27 meq/L (ref 19–32)
Calcium: 9.7 mg/dL (ref 8.4–10.5)
Chloride: 104 meq/L (ref 96–112)
Creatinine, Ser: 0.84 mg/dL (ref 0.40–1.20)
GFR: 70.85 mL/min (ref >60.00)
Glucose, Bld: 105 mg/dL — ABNORMAL HIGH (ref 70–99)
Potassium: 4.3 meq/L (ref 3.5–5.1)
Sodium: 138 meq/L (ref 135–145)

## 2023-11-12 NOTE — Progress Notes (Signed)
Subjective:     Patient ID: Chloe Reid, female    DOB: 02-26-1956, 67 y.o.   MRN: [id number]  Chief Complaint  Patient presents with   Hypertension    Here for follow up    Hypertension    Discussed the use of AI scribe software for clinical note transcription with the patient, who gave verbal consent to proceed.  History of Present Illness   The patient, with a history of hypertension and neck pain, presents for a follow-up visit. Last visit we increased her diovan from 20mg  to 40mg . The patient's blood pressure today is 138/65, compared to 162/70 at the last visit. The patient also had an episode of lightheadedness, which has since resolved. The patient had a mechanical fall at home recently, but did not lose consciousness. and did not sustain any injury. The patient took Tylenol for pain after the fall.     She notes some improvement in the neck pain she was experiencing last visit. She has not completed the x-ray of her cspine we ordered last visit.     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   Cataracts, bilateral    Glaucoma    Hypertension    Kidney stone    Macular drusen    Migraine    Pre-diabetes     Past Surgical History:  Procedure Laterality Date   BREAST LUMPECTOMY WITH RADIOACTIVE SEED AND SENTINEL LYMPH NODE BIOPSY Left 08/20/2022   Procedure: LEFT BREAST LUMPECTOMY WITH RADIOACTIVE SEED AND SENTINEL LYMPH NODE BIOPSY;  Surgeon: Almond Lint, MD;  Location: [facility];  Service: General;  Laterality: Left;   cataract left Left 10/2022   EYE  SURGERY Right    cataract   uterine biopsy      Family History  Problem Relation Age of Onset   Heart disease Mother    Heart disease Father    Kidney disease Father     Social History   Socioeconomic History   Marital status: Married    Spouse name: Not on file   Number of children: Not on file   Years of education: Not on file   Highest education level: Not on file  Occupational History   Not on file  Tobacco Use   Smoking status: Never   Smokeless tobacco: Never  Vaping Use   Vaping status: Never Used  Substance and Sexual Activity   Alcohol use: No   Drug use: Never   Sexual activity: Not Currently    Birth control/protection: Post-menopausal  Other Topics Concern   Not on file  Social History Narrative   Has 4 children- 3 daughter's 1 son, all live at home   Married   Cooks at [employer]   Enjoys television, cooking   No pets   Grew up in [place]- moved here in 1998   Social Drivers of Health   Financial Resource Strain: Not on [employer] Insecurity: Not on file  Transportation Needs: Not on file  Physical Activity: Not  on file  Stress: Not on file  Social Connections: Not on file  Intimate Partner Violence: Not on file    Outpatient Medications Prior to Visit  Medication Sig Dispense Refill   acetaminophen (TYLENOL) 500 MG tablet Take 1 tablet (500 mg total) by mouth every 6 (six) hours as needed. 30 tablet 0   anastrozole (ARIMIDEX) 1 MG tablet Take 1 tablet by mouth once daily 90 tablet 0   blood glucose meter kit and supplies KIT Dispense based on patient and insurance preference. Use up to four times daily as directed. 1 each 0   Blood Glucose Monitoring Suppl DEVI 1 each by Does not apply route in the morning, at noon, and at bedtime. May substitute to any manufacturer covered by patient's insurance. 1 each 0   calcium-vitamin D (OSCAL WITH D) 500-200 MG-UNIT tablet Take 1 tablet by mouth 3 (three) times daily. 90 tablet 12   atorvastatin  (LIPITOR) 20 MG tablet Take 1 tablet by mouth once daily 90 tablet 0   valsartan (DIOVAN) 40 MG tablet Take 1 tablet (40 mg total) by mouth daily.     No facility-administered medications prior to visit.    Allergies  Allergen Reactions   Lisinopril     Dizziness    ROS See HPI    Objective:    Physical Exam Constitutional:      General: She is not in acute distress.    Appearance: Normal appearance. She is well-developed.  HENT:     Head: Normocephalic and atraumatic.     Right Ear: External ear normal.     Left Ear: External ear normal.  Eyes:     General: No scleral icterus. Neck:     Thyroid: No thyromegaly.  Cardiovascular:     Rate and Rhythm: Normal rate and regular rhythm.     Heart sounds: Normal heart sounds. No murmur heard. Pulmonary:     Effort: Pulmonary effort is normal. No respiratory distress.     Breath sounds: Normal breath sounds. No wheezing.  Musculoskeletal:     Cervical back: Neck supple.  Skin:    General: Skin is warm and dry.  Neurological:     Mental Status: She is alert and oriented to person, place, and time.  Psychiatric:        Mood and Affect: Mood normal.        Behavior: Behavior normal.        Thought Content: Thought content normal.        Judgment: Judgment normal.      BP 138/65 (BP Location: Right Arm, Patient Position: Sitting, Cuff Size: Normal)   Pulse 62   Temp 97.8 F (36.6 C) (Oral)   Resp 16   LMP 12/02/2003   SpO2 99%  Wt Readings from Last 3 Encounters:  10/27/23 154 lb (69.9 kg)  02/16/23 156 lb 9.6 oz (71 kg)  02/10/23 157 lb (71.2 kg)       Assessment & Plan:   Problem List Items Addressed This Visit       Unprioritized   Primary hypertension - Primary    Improved blood pressure control with increase in Valsartan from 20mg  to 40mg  daily. Current BP 138/65, improved from 162/70. -Continue Valsartan 40mg  daily. -Check potassium levels due to medication adjustment.      Relevant Medications    valsartan (DIOVAN) 40 MG tablet   atorvastatin (LIPITOR) 20 MG tablet   Other Relevant Orders   Basic Metabolic Panel (BMET) (Completed)  Neck pain    Ongoing neck pain, yet to have x-ray. No worsening of symptoms. -Obtain neck x-ray today. -Continue current pain management with Tylenol as needed.       I have changed Aristea Florer's atorvastatin. I am also having her maintain her acetaminophen, calcium-vitamin D, blood glucose meter kit and supplies, Blood Glucose Monitoring Suppl, anastrozole, and valsartan.  Meds ordered this encounter  Medications   valsartan (DIOVAN) 40 MG tablet    Sig: Take 1 tablet (40 mg total) by mouth daily.    Dispense:  90 tablet    Refill:  1    Supervising Provider:   Danise Edge A [[id number]]   atorvastatin (LIPITOR) 20 MG tablet    Sig: Take 1 tablet (20 mg total) by mouth daily.    Dispense:  90 tablet    Refill:  1    Supervising Provider:   Danise Edge A [[id number]]

## 2023-11-12 NOTE — Patient Instructions (Signed)
VISIT SUMMARY:  You came in today for a follow-up visit regarding your hypertension and neck pain. Your blood pressure has improved since your last visit, and you mentioned a recent episode of lightheadedness and a fall at home, which did not result in any serious injury.  YOUR PLAN:  -HYPERTENSION: Hypertension means high blood pressure. Your blood pressure has improved with the increase in your Valsartan dosage to 40mg  daily. Please continue taking Valsartan 40mg  daily, and we will check your potassium levels due to the medication adjustment.  -NECK PAIN: You have ongoing neck pain, but it has not worsened. We will obtain a neck x-ray today to further investigate. Continue managing your pain with Tylenol as needed.  -FALL: You had a recent fall in your kitchen but did not lose consciousness or experience dizziness. There are no new symptoms or worsening of your neck pain, so no immediate intervention is required.  -MEDICATION REFILLS: We will send refills for your Valsartan and Lipitor to [facility].  INSTRUCTIONS:  Please follow up in 4 months for a blood pressure check, unless the x-ray results necessitate an earlier visit.

## 2023-11-12 NOTE — Assessment & Plan Note (Signed)
  Ongoing neck pain, yet to have x-ray. No worsening of symptoms. -Obtain neck x-ray today. -Continue current pain management with Tylenol as needed.

## 2023-11-12 NOTE — Assessment & Plan Note (Signed)
  Improved blood pressure control with increase in Valsartan from 20mg  to 40mg  daily. Current BP 138/65, improved from 162/70. -Continue Valsartan 40mg  daily. -Check potassium levels due to medication adjustment.

## 2023-11-13 ENCOUNTER — Other Ambulatory Visit: Payer: Self-pay | Admitting: *Deleted

## 2023-11-13 DIAGNOSIS — C50412 Malignant neoplasm of upper-outer quadrant of left female breast: Secondary | ICD-10-CM

## 2023-11-16 ENCOUNTER — Inpatient Hospital Stay: Payer: Medicare Other | Attending: Hematology and Oncology

## 2023-11-16 ENCOUNTER — Inpatient Hospital Stay: Payer: Medicare Other | Admitting: Hematology and Oncology

## 2023-11-16 NOTE — Assessment & Plan Note (Signed)
08/20/2022:Left lumpectomy: Grade 2 IDC 1.8 cm, extensive perineural invasion.  With DCIS grade 2, margins negative, 0/3 lymph nodes negative ER 95%, PR 20%, HER2 1+ negative, Ki-67 5%  Oncotype DX score 20: Risk of recurrence: 6% 10/30/2022: Adjuvant radiation completed   Treatment plan: Adjuvant antiestrogen therapy with anastrozole 1 mg daily x5 to 7 years   Anastrozole toxicities:  Breast cancer surveillance: Breast exam 11/16/2023: Benign Mammogram has been ordered for 07/06/2023 but has not been done.  Bone density: 11/18/2022: T-score -1.7: Osteopenia: Recommended calcium vitamin D and weightbearing exercises  Return to clinic in 1 year for follow-up

## 2023-11-27 ENCOUNTER — Encounter (HOSPITAL_BASED_OUTPATIENT_CLINIC_OR_DEPARTMENT_OTHER): Payer: Self-pay

## 2023-11-27 ENCOUNTER — Other Ambulatory Visit: Payer: Self-pay

## 2023-11-27 ENCOUNTER — Emergency Department (HOSPITAL_BASED_OUTPATIENT_CLINIC_OR_DEPARTMENT_OTHER)
Admission: EM | Admit: 2023-11-27 | Discharge: 2023-11-28 | Disposition: A | Discharge status: Home / Self Care | Payer: Medicare Other | Attending: Emergency Medicine | Admitting: Emergency Medicine

## 2023-11-27 DIAGNOSIS — R251 Tremor, unspecified: Secondary | ICD-10-CM | POA: Insufficient documentation

## 2023-11-27 DIAGNOSIS — Z79899 Other long term (current) drug therapy: Secondary | ICD-10-CM | POA: Insufficient documentation

## 2023-11-27 DIAGNOSIS — E871 Hypo-osmolality and hyponatremia: Secondary | ICD-10-CM | POA: Insufficient documentation

## 2023-11-27 DIAGNOSIS — R112 Nausea with vomiting, unspecified: Secondary | ICD-10-CM | POA: Insufficient documentation

## 2023-11-27 DIAGNOSIS — R059 Cough, unspecified: Secondary | ICD-10-CM | POA: Insufficient documentation

## 2023-11-27 DIAGNOSIS — M542 Cervicalgia: Secondary | ICD-10-CM | POA: Insufficient documentation

## 2023-11-27 DIAGNOSIS — Z20822 Contact with and (suspected) exposure to covid-19: Secondary | ICD-10-CM | POA: Insufficient documentation

## 2023-11-27 DIAGNOSIS — B349 Viral infection, unspecified: Secondary | ICD-10-CM | POA: Diagnosis not present

## 2023-11-27 DIAGNOSIS — R519 Headache, unspecified: Secondary | ICD-10-CM | POA: Insufficient documentation

## 2023-11-27 DIAGNOSIS — R7303 Prediabetes: Secondary | ICD-10-CM | POA: Insufficient documentation

## 2023-11-27 DIAGNOSIS — Z853 Personal history of malignant neoplasm of breast: Secondary | ICD-10-CM | POA: Insufficient documentation

## 2023-11-27 DIAGNOSIS — I10 Essential (primary) hypertension: Secondary | ICD-10-CM | POA: Diagnosis not present

## 2023-11-27 DIAGNOSIS — R6883 Chills (without fever): Secondary | ICD-10-CM | POA: Insufficient documentation

## 2023-11-27 LAB — CBC WITH DIFFERENTIAL/PLATELET
Abs Immature Granulocytes: 0.02 10*3/uL (ref 0.00–0.07)
Basophils Absolute: 0 10*3/uL (ref 0.0–0.1)
Basophils Relative: 0 %
Eosinophils Absolute: 0 10*3/uL (ref 0.0–0.5)
Eosinophils Relative: 0 %
HCT: 36.6 % (ref 36.0–46.0)
Hemoglobin: 11.8 g/dL — ABNORMAL LOW (ref 12.0–15.0)
Immature Granulocytes: 0 %
Lymphocytes Relative: 14 %
Lymphs Abs: 0.9 10*3/uL (ref 0.7–4.0)
MCH: 24.6 pg — ABNORMAL LOW (ref 26.0–34.0)
MCHC: 32.2 g/dL (ref 30.0–36.0)
MCV: 76.4 fL — ABNORMAL LOW (ref 80.0–100.0)
Monocytes Absolute: 0.4 10*3/uL (ref 0.1–1.0)
Monocytes Relative: 5 %
Neutro Abs: 5.3 10*3/uL (ref 1.7–7.7)
Neutrophils Relative %: 81 %
Platelets: 231 10*3/uL (ref 150–400)
RBC: 4.79 MIL/uL (ref 3.87–5.11)
RDW: 13.9 % (ref 11.5–15.5)
WBC: 6.6 10*3/uL (ref 4.0–10.5)
nRBC: 0 % (ref 0.0–0.2)

## 2023-11-27 LAB — COMPREHENSIVE METABOLIC PANEL
ALT: 25 U/L (ref 0–44)
AST: 34 U/L (ref 15–41)
Albumin: 3.6 g/dL (ref 3.5–5.0)
Alkaline Phosphatase: 70 U/L (ref 38–126)
Anion gap: 10 (ref 5–15)
BUN: 13 mg/dL (ref 8–23)
CO2: 22 mmol/L (ref 22–32)
Calcium: 9 mg/dL (ref 8.9–10.3)
Chloride: 97 mmol/L — ABNORMAL LOW (ref 98–111)
Creatinine, Ser: 0.55 mg/dL (ref 0.44–1.00)
GFR, Estimated: >60 mL/min (ref >60)
Glucose, Bld: 111 mg/dL — ABNORMAL HIGH (ref 70–99)
Potassium: 3.7 mmol/L (ref 3.5–5.1)
Sodium: 129 mmol/L — ABNORMAL LOW (ref 135–145)
Total Bilirubin: 0.7 mg/dL (ref <1.2)
Total Protein: 7.1 g/dL (ref 6.5–8.1)

## 2023-11-27 LAB — RESP PANEL BY RT-PCR (RSV, FLU A&B, COVID)  RVPGX2
Influenza A by PCR: NEGATIVE
Influenza B by PCR: NEGATIVE
Resp Syncytial Virus by PCR: NEGATIVE
SARS Coronavirus 2 by RT PCR: NEGATIVE

## 2023-11-27 LAB — LIPASE, BLOOD: Lipase: 28 U/L (ref 11–51)

## 2023-11-27 LAB — TROPONIN I (HIGH SENSITIVITY): Troponin I (High Sensitivity): 4 ng/L (ref <18)

## 2023-11-27 MED ORDER — HYDROMORPHONE HCL 1 MG/ML IJ SOLN
0.5000 mg | Freq: Once | INTRAMUSCULAR | Status: AC
  Administered 2023-11-27: 0.5 mg via INTRAVENOUS
  Filled 2023-11-27: qty 1

## 2023-11-27 MED ORDER — ONDANSETRON 4 MG PO TBDP
4.0000 mg | ORAL_TABLET | Freq: Once | ORAL | Status: AC
  Administered 2023-11-27: 4 mg via ORAL
  Filled 2023-11-27: qty 1

## 2023-11-27 MED ORDER — KETOROLAC TROMETHAMINE 30 MG/ML IJ SOLN
15.0000 mg | Freq: Once | INTRAMUSCULAR | Status: AC
  Administered 2023-11-27: 15 mg via INTRAVENOUS
  Filled 2023-11-27: qty 1

## 2023-11-27 MED ORDER — PROCHLORPERAZINE EDISYLATE 10 MG/2ML IJ SOLN
5.0000 mg | Freq: Once | INTRAMUSCULAR | Status: AC
  Administered 2023-11-27: 5 mg via INTRAVENOUS
  Filled 2023-11-27: qty 2

## 2023-11-27 MED ORDER — SODIUM CHLORIDE 0.9 % IV BOLUS
1000.0000 mL | Freq: Once | INTRAVENOUS | Status: AC
  Administered 2023-11-27: 1000 mL via INTRAVENOUS

## 2023-11-27 MED ORDER — SODIUM CHLORIDE 0.9 % IV BOLUS
1000.0000 mL | Freq: Once | INTRAVENOUS | Status: AC
  Administered 2023-11-28: 1000 mL via INTRAVENOUS

## 2023-11-27 MED ORDER — DEXAMETHASONE SODIUM PHOSPHATE 10 MG/ML IJ SOLN
10.0000 mg | Freq: Once | INTRAMUSCULAR | Status: AC
  Administered 2023-11-27: 10 mg via INTRAVENOUS
  Filled 2023-11-27: qty 1

## 2023-11-27 NOTE — ED Triage Notes (Signed)
Pt arrives with c/o generalized bodyaches, n/v, headache, and chills. Pt was around sick grandchild a couple of days ago. Pt denies fevers.

## 2023-11-27 NOTE — ED Provider Notes (Cosign Needed)
[facility] Provider Note   CSN: [id number] Arrival date & time: 11/27/23  1747     History  Chief Complaint  Patient presents with   Flu-Like Symptoms    Chloe Reid is a 67 y.o. female brought in by her daughter for flulike symptoms.  Patient began having cough shaking chills nausea and vomiting she had multiple episodes of the same last night.  She has a past medical history of prediabetes, chronic neck pain, ligament neoplasm of the breast, hypertension, and chronic migraines.  Patient currently complex needing of throbbing headache and neck pain.  She denies any other neurologic deficits.  HPI     Home Medications Prior to Admission medications   Medication Sig Start Date End Date Taking? Authorizing Provider  acetaminophen (TYLENOL) 500 MG tablet Take 1 tablet (500 mg total) by mouth every 6 (six) hours as needed. 05/30/17   Audry Pili, PA-C  anastrozole (ARIMIDEX) 1 MG tablet Take 1 tablet by mouth once daily 10/07/23   Serena Croissant, MD  atorvastatin (LIPITOR) 20 MG tablet Take 1 tablet (20 mg total) by mouth daily. 11/10/23   Sandford Craze, NP  blood glucose meter kit and supplies KIT Dispense based on patient and insurance preference. Use up to four times daily as directed. 11/18/22   Sandford Craze, NP  Blood Glucose Monitoring Suppl DEVI 1 each by Does not apply route in the morning, at noon, and at bedtime. May substitute to any manufacturer covered by patient's insurance. 02/10/23   Sandford Craze, NP  calcium-vitamin D (OSCAL WITH D) 500-200 MG-UNIT tablet Take 1 tablet by mouth 3 (three) times daily. 06/15/17   Tarry Kos, MD  valsartan (DIOVAN) 40 MG tablet Take 1 tablet (40 mg total) by mouth daily. 11/10/23   Sandford Craze, NP      Allergies    Lisinopril    Review of Systems   Review of Systems  Physical Exam Updated Vital Signs BP (!) 164/76   Pulse 71   Temp 97.7 F (36.5 C) (Temporal)    Resp 13   Wt 69.9 kg   LMP 12/02/2003   SpO2 98%   BMI 27.28 kg/m  Physical Exam Vitals and nursing note reviewed.  Constitutional:      General: She is not in acute distress.    Appearance: She is well-developed. She is ill-appearing. She is not diaphoretic.     Comments: Patient appears weak and dry  HENT:     Head: Normocephalic and atraumatic.     Right Ear: External ear normal.     Left Ear: External ear normal.     Nose: Nose normal.     Mouth/Throat:     Mouth: Mucous membranes are dry.  Eyes:     General: No scleral icterus.    Conjunctiva/sclera: Conjunctivae normal.  Cardiovascular:     Rate and Rhythm: Normal rate and regular rhythm.     Heart sounds: Normal heart sounds. No murmur heard.    No friction rub. No gallop.  Pulmonary:     Effort: Pulmonary effort is normal. No respiratory distress.     Breath sounds: Normal breath sounds.  Abdominal:     General: Bowel sounds are normal. There is no distension.     Palpations: Abdomen is soft. There is no mass.     Tenderness: There is no abdominal tenderness. There is no guarding.  Musculoskeletal:     Cervical back: Normal range of motion.  Skin:    General: Skin is warm and dry.  Neurological:     Mental Status: She is alert and oriented to person, place, and time.  Psychiatric:        Behavior: Behavior normal.     ED Results / Procedures / Treatments   Labs (all labs ordered are listed, but only abnormal results are displayed) Labs Reviewed  CBC WITH DIFFERENTIAL/PLATELET - Abnormal; Notable for the following components:      Result Value   Hemoglobin 11.8 (*)    MCV 76.4 (*)    MCH 24.6 (*)    All other components within normal limits  COMPREHENSIVE METABOLIC PANEL - Abnormal; Notable for the following components:   Sodium 129 (*)    Chloride 97 (*)    Glucose, Bld 111 (*)    All other components within normal limits  RESP PANEL BY RT-PCR (RSV, FLU A&B, COVID)  RVPGX2  LIPASE, BLOOD   URINALYSIS, ROUTINE W REFLEX MICROSCOPIC  TROPONIN I (HIGH SENSITIVITY)    EKG EKG Interpretation Date/Time:  Friday November 27 2023 22:30:15 EST Ventricular Rate:  70 PR Interval:  178 QRS Duration:  87 QT Interval:  400 QTC Calculation: 432 R Axis:   14  Text Interpretation: Sinus rhythm Confirmed by Vanetta Mulders [phone]) on 11/27/2023 10:31:44 PM  Radiology No results found.  Procedures Procedures    Medications Ordered in ED Medications  ondansetron (ZOFRAN-ODT) disintegrating tablet 4 mg (4 mg Oral Given 11/27/23 1808)  sodium chloride 0.9 % bolus 1,000 mL (1,000 mLs Intravenous New Bag/Given 11/27/23 2240)  prochlorperazine (COMPAZINE) injection 5 mg (5 mg Intravenous Given 11/27/23 2242)  HYDROmorphone (DILAUDID) injection 0.5 mg (0.5 mg Intravenous Given 11/27/23 2304)    ED Course/ Medical Decision Making/ A&P Clinical Course as of 11/27/23 2353  Fri Nov 27, 2023  2308 Sodium(!): 129 [AH]  2316 Troponin I (High Sensitivity) [AH]  2316 Glucose(!): 111 [AH]  2316 CBC with Differential(!) [AH]  2316 Hemoglobin(!): 11.8 [AH]  2316 WBC: 6.6 [AH]  2316 Lipase, blood [AH]  2317 Lipase: 28 [AH]    Clinical Course User Index [AH] Arthor Captain, PA-C                                 Medical Decision Making Amount and/or Complexity of Data Reviewed Labs: ordered. Decision-making details documented in ED Course. ECG/medicine tests: ordered.  Risk Prescription drug management.   Patient with flulike symptoms.  I reviewed the patient's labs.  Does not appear that her nausea and vomiting are secondary to ACS.  She has a benign abdominal exam and I doubt any intra-abdominal cause.  Still awaiting urine.  Patient states that her nausea is resolved.  She still has a bit of a headache.  Will receive another bolus of fluid, Decadron, Toradol.  Signout given to Dr. Bernette Mayers at shift handoff.  She will need reevaluation.        Final Clinical Impression(s) /  ED Diagnoses Final diagnoses:  None    Rx / DC Orders ED Discharge Orders     None         Arthor Captain, PA-C 11/28/23 0018

## 2023-11-27 NOTE — ED Provider Notes (Incomplete)
[facility] Provider Note   CSN: [id number] Arrival date & time: 11/27/23  1747     History {Add pertinent medical, surgical, social history, OB history to HPI:1} Chief Complaint  Patient presents with  . Flu-Like Symptoms    Chloe Reid is a 67 y.o. female brought in by her daughter for flulike symptoms.  Patient began having cough shaking chills nausea and vomiting she had multiple episodes of the same last night.  She has a past medical history of prediabetes, chronic neck pain, ligament neoplasm of the breast, hypertension, and chronic migraines.  Patient currently complex needing of throbbing headache and neck pain.  She denies any other neurologic deficits.  HPI     Home Medications Prior to Admission medications   Medication Sig Start Date End Date Taking? Authorizing Provider  acetaminophen (TYLENOL) 500 MG tablet Take 1 tablet (500 mg total) by mouth every 6 (six) hours as needed. 05/30/17   Audry Pili, PA-C  anastrozole (ARIMIDEX) 1 MG tablet Take 1 tablet by mouth once daily 10/07/23   Serena Croissant, MD  atorvastatin (LIPITOR) 20 MG tablet Take 1 tablet (20 mg total) by mouth daily. 11/10/23   Sandford Craze, NP  blood glucose meter kit and supplies KIT Dispense based on patient and insurance preference. Use up to four times daily as directed. 11/18/22   Sandford Craze, NP  Blood Glucose Monitoring Suppl DEVI 1 each by Does not apply route in the morning, at noon, and at bedtime. May substitute to any manufacturer covered by patient's insurance. 02/10/23   Sandford Craze, NP  calcium-vitamin D (OSCAL WITH D) 500-200 MG-UNIT tablet Take 1 tablet by mouth 3 (three) times daily. 06/15/17   Tarry Kos, MD  valsartan (DIOVAN) 40 MG tablet Take 1 tablet (40 mg total) by mouth daily. 11/10/23   Sandford Craze, NP      Allergies    Lisinopril    Review of Systems   Review of Systems  Physical Exam Updated  Vital Signs BP (!) 164/76   Pulse 71   Temp 97.7 F (36.5 C) (Temporal)   Resp 13   Wt 69.9 kg   LMP 12/02/2003   SpO2 98%   BMI 27.28 kg/m  Physical Exam  ED Results / Procedures / Treatments   Labs (all labs ordered are listed, but only abnormal results are displayed) Labs Reviewed  CBC WITH DIFFERENTIAL/PLATELET - Abnormal; Notable for the following components:      Result Value   Hemoglobin 11.8 (*)    MCV 76.4 (*)    MCH 24.6 (*)    All other components within normal limits  COMPREHENSIVE METABOLIC PANEL - Abnormal; Notable for the following components:   Sodium 129 (*)    Chloride 97 (*)    Glucose, Bld 111 (*)    All other components within normal limits  RESP PANEL BY RT-PCR (RSV, FLU A&B, COVID)  RVPGX2  LIPASE, BLOOD  URINALYSIS, ROUTINE W REFLEX MICROSCOPIC  TROPONIN I (HIGH SENSITIVITY)    EKG EKG Interpretation Date/Time:  Friday November 27 2023 22:30:15 EST Ventricular Rate:  70 PR Interval:  178 QRS Duration:  87 QT Interval:  400 QTC Calculation: 432 R Axis:   14  Text Interpretation: Sinus rhythm Confirmed by Vanetta Mulders [phone]) on 11/27/2023 10:31:44 PM  Radiology No results found.  Procedures Procedures  {Document cardiac monitor, telemetry assessment procedure when appropriate:1}  Medications Ordered in ED Medications  ondansetron (ZOFRAN-ODT) disintegrating tablet  4 mg (4 mg Oral Given 11/27/23 1808)  sodium chloride 0.9 % bolus 1,000 mL (1,000 mLs Intravenous New Bag/Given 11/27/23 2240)  prochlorperazine (COMPAZINE) injection 5 mg (5 mg Intravenous Given 11/27/23 2242)  HYDROmorphone (DILAUDID) injection 0.5 mg (0.5 mg Intravenous Given 11/27/23 2304)    ED Course/ Medical Decision Making/ A&P Clinical Course as of 11/27/23 2353  Fri Nov 27, 2023  2308 Sodium(!): 129 [AH]  2316 Troponin I (High Sensitivity) [AH]  2316 Glucose(!): 111 [AH]  2316 CBC with Differential(!) [AH]  2316 Hemoglobin(!): 11.8 [AH]  2316 WBC: 6.6  [AH]  2316 Lipase, blood [AH]  2317 Lipase: 28 [AH]    Clinical Course User Index [AH] Arthor Captain, PA-C   {   Click here for ABCD2, HEART and other calculatorsREFRESH Note before signing :1}                              Medical Decision Making Amount and/or Complexity of Data Reviewed Labs: ordered. Decision-making details documented in ED Course. ECG/medicine tests: ordered.  Risk Prescription drug management.   ***  {Document critical care time when appropriate:1} {Document review of labs and clinical decision tools ie heart score, Chads2Vasc2 etc:1}  {Document your independent review of radiology images, and any outside records:1} {Document your discussion with family members, caretakers, and with consultants:1} {Document social determinants of health affecting pt's care:1} {Document your decision making why or why not admission, treatments were needed:1} Final Clinical Impression(s) / ED Diagnoses Final diagnoses:  None    Rx / DC Orders ED Discharge Orders     None

## 2023-11-28 ENCOUNTER — Emergency Department (HOSPITAL_BASED_OUTPATIENT_CLINIC_OR_DEPARTMENT_OTHER): Payer: Medicare Other

## 2023-11-28 DIAGNOSIS — B349 Viral infection, unspecified: Secondary | ICD-10-CM | POA: Diagnosis not present

## 2023-11-28 LAB — URINALYSIS, ROUTINE W REFLEX MICROSCOPIC
Bilirubin Urine: NEGATIVE
Glucose, UA: NEGATIVE mg/dL
Hgb urine dipstick: NEGATIVE
Ketones, ur: 15 mg/dL — AB
Leukocytes,Ua: NEGATIVE
Nitrite: NEGATIVE
Protein, ur: NEGATIVE mg/dL
Specific Gravity, Urine: 1.01 (ref 1.005–1.030)
pH: 5.5 (ref 5.0–8.0)

## 2023-11-28 MED ORDER — ONDANSETRON 4 MG PO TBDP: 4.0000 mg | ORAL_TABLET | Freq: Three times a day (TID) | ORAL | 0 refills | Status: DC | PRN

## 2023-11-28 NOTE — ED Provider Notes (Signed)
Care assumed at shift change. Here for viral syndrome. Awaiting UA and recheck after IVF.   Physical Exam  BP 121/72   Pulse 70   Temp 97.7 F (36.5 C) (Temporal)   Resp 17   Wt 69.9 kg   LMP 12/02/2003   SpO2 96%   BMI 27.28 kg/m   Physical Exam  Procedures  Procedures  ED Course / MDM   Clinical Course as of 11/28/23 0147  Fri Nov 27, 2023  2308 Sodium(!): 129 [AH]  2316 Troponin I (High Sensitivity) [AH]  2316 Glucose(!): 111 [AH]  2316 CBC with Differential(!) [AH]  2316 Hemoglobin(!): 11.8 [AH]  2316 WBC: 6.6 [AH]  2316 Lipase, blood [AH]  2317 Lipase: 28 [AH]  Sat Nov 28, 2023  0147 UA is unremarkable. Vitals are reassuring. Patient reports she is feeling better. Able to ambulate to the bathroom without much difficulty and would like to go home. Will give Rx for Zofran if needed. PCP follow up, RTED for any other concerns.  [CS]    Clinical Course User Index [AH] Arthor Captain, PA-C [CS] Pollyann Savoy, MD   Medical Decision Making Given presenting complaint, I considered that admission might be necessary. After review of results from ED lab and/or imaging studies, admission to the hospital is not indicated at this time.    Problems Addressed: Hyponatremia: acute illness or injury Viral syndrome: acute illness or injury  Amount and/or Complexity of Data Reviewed Labs: ordered. Decision-making details documented in ED Course. Radiology: ordered and independent interpretation performed. Decision-making details documented in ED Course.  Risk Prescription drug management. Decision regarding hospitalization.          Pollyann Savoy, MD 11/28/23 [phone]

## 2023-11-30 ENCOUNTER — Telehealth: Payer: Self-pay | Admitting: *Deleted

## 2023-11-30 NOTE — Transitions of Care (Post Inpatient/ED Visit) (Signed)
   11/30/2023  Name: Chloe Reid MRN: [id number] DOB: 12-15-1955  Today's TOC FU Call Status: Today's TOC FU Call Status:: Unsuccessful Call (1st Attempt) Unsuccessful Call (1st Attempt) Date: 11/30/23  Attempted to reach the patient regarding the most recent Inpatient/ED visit.  Follow Up Plan: Additional outreach attempts will be made to reach the patient to complete the Transitions of Care (Post Inpatient/ED visit) call.   Signature Kevionna Heffler, [facility]

## 2023-12-15 ENCOUNTER — Inpatient Hospital Stay (HOSPITAL_BASED_OUTPATIENT_CLINIC_OR_DEPARTMENT_OTHER): Payer: Medicare Other | Admitting: Hematology and Oncology

## 2023-12-15 ENCOUNTER — Inpatient Hospital Stay: Payer: Medicare Other | Attending: Hematology and Oncology

## 2023-12-15 VITALS — BP 150/82 | HR 70 | Temp 97.9°F | Resp 18 | Ht 63.0 in | Wt 152.9 lb

## 2023-12-15 DIAGNOSIS — R5383 Other fatigue: Secondary | ICD-10-CM | POA: Insufficient documentation

## 2023-12-15 DIAGNOSIS — C50412 Malignant neoplasm of upper-outer quadrant of left female breast: Secondary | ICD-10-CM | POA: Diagnosis present

## 2023-12-15 DIAGNOSIS — Z170 Estrogen receptor positive status [ER+]: Secondary | ICD-10-CM

## 2023-12-15 DIAGNOSIS — N951 Menopausal and female climacteric states: Secondary | ICD-10-CM | POA: Diagnosis not present

## 2023-12-15 DIAGNOSIS — Z79811 Long term (current) use of aromatase inhibitors: Secondary | ICD-10-CM | POA: Insufficient documentation

## 2023-12-15 LAB — CMP (CANCER CENTER ONLY)
ALT: 16 U/L (ref 0–44)
AST: 16 U/L (ref 15–41)
Albumin: 3.9 g/dL (ref 3.5–5.0)
Alkaline Phosphatase: 66 U/L (ref 38–126)
Anion gap: 4 — ABNORMAL LOW (ref 5–15)
BUN: 11 mg/dL (ref 8–23)
CO2: 28 mmol/L (ref 22–32)
Calcium: 9.6 mg/dL (ref 8.9–10.3)
Chloride: 108 mmol/L (ref 98–111)
Creatinine: 0.69 mg/dL (ref 0.44–1.00)
GFR, Estimated: >60 mL/min (ref >60)
Glucose, Bld: 100 mg/dL — ABNORMAL HIGH (ref 70–99)
Potassium: 4 mmol/L (ref 3.5–5.1)
Sodium: 140 mmol/L (ref 135–145)
Total Bilirubin: 0.6 mg/dL (ref 0.0–1.2)
Total Protein: 7.2 g/dL (ref 6.5–8.1)

## 2023-12-15 LAB — CBC WITH DIFFERENTIAL (CANCER CENTER ONLY)
Abs Immature Granulocytes: 0.01 10*3/uL (ref 0.00–0.07)
Basophils Absolute: 0 10*3/uL (ref 0.0–0.1)
Basophils Relative: 1 %
Eosinophils Absolute: 0.2 10*3/uL (ref 0.0–0.5)
Eosinophils Relative: 3 %
HCT: 36.5 % (ref 36.0–46.0)
Hemoglobin: 11.9 g/dL — ABNORMAL LOW (ref 12.0–15.0)
Immature Granulocytes: 0 %
Lymphocytes Relative: 31 %
Lymphs Abs: 2.4 10*3/uL (ref 0.7–4.0)
MCH: 25.2 pg — ABNORMAL LOW (ref 26.0–34.0)
MCHC: 32.6 g/dL (ref 30.0–36.0)
MCV: 77.2 fL — ABNORMAL LOW (ref 80.0–100.0)
Monocytes Absolute: 0.6 10*3/uL (ref 0.1–1.0)
Monocytes Relative: 8 %
Neutro Abs: 4.4 10*3/uL (ref 1.7–7.7)
Neutrophils Relative %: 57 %
Platelet Count: 226 10*3/uL (ref 150–400)
RBC: 4.73 MIL/uL (ref 3.87–5.11)
RDW: 13.8 % (ref 11.5–15.5)
WBC Count: 7.6 10*3/uL (ref 4.0–10.5)
nRBC: 0 % (ref 0.0–0.2)

## 2023-12-15 NOTE — Progress Notes (Signed)
 Patient Care Team: Daryl Setter, NP as PCP - General (Internal Medicine) Aron Shoulders, MD as Consulting Physician (General Surgery) Odean Potts, MD as Consulting Physician (Hematology and Oncology) Shannon Agent, MD as Consulting Physician (Radiation Oncology)  DIAGNOSIS:  Encounter Diagnosis  Name Primary?   Malignant neoplasm of upper-outer quadrant of left breast in female, estrogen receptor positive (HCC) Yes    SUMMARY OF ONCOLOGIC HISTORY: Oncology History  Malignant neoplasm of upper-outer quadrant of left breast in female, estrogen receptor positive (HCC)  07/01/2022 Initial Diagnosis   Screening mammogram detected left breast mass at 2 o'clock position 1 cm in size.  Ultrasound-guided biopsy revealed grade 2 IDC with DCIS intermediate grade, axilla negative, ER 95%, PR 20%, Ki-67 5%, HER2 negative   07/09/2022 Cancer Staging   Staging form: Breast, AJCC 8th Edition - Clinical: Stage IA (cT1b, cN0, cM0, G2, ER+, PR+, HER2-) - Signed by Odean Potts, MD on 07/09/2022 Stage prefix: Initial diagnosis Histologic grading system: 3 grade system   08/20/2022 Surgery   Left lumpectomy: Grade 2 IDC 1.8 cm, extensive perineural invasion.  With DCIS grade 2, margins negative, 0/3 lymph nodes negative ER 95%, PR 20%, HER2 1+ negative, Ki-67 5%   08/20/2022 Oncotype testing   20/6%   09/16/2022 Cancer Staging   Staging form: Breast, AJCC 8th Edition - Pathologic stage from 09/16/2022: Stage IA (pT1c, pN0, cM0, G2, ER+, PR+, HER2-) - Signed by Izell Domino, MD on 09/16/2022 Histologic grading system: 3 grade system   10/02/2022 - 10/20/2022 Radiation Therapy   Left Breast: 40.05 Gy in 15 treatments "Boost": 10 Gy in 5 treatments    10/2022 -  Anti-estrogen oral therapy   Anastrozole  x 5-7 years     CHIEF COMPLIANT: Follow-up on anastrozole  therapy  HISTORY OF PRESENT ILLNESS:  History of Present Illness   The patient, with a history of cancer, presents with a  complaint of general weakness and occasional pain. The weakness is significant enough to cause exhaustion after minor activities such as washing dishes. This has been an ongoing issue since the patient's cancer treatment, initially attributed to the effects of radiation therapy. The patient also reports an incident of severe pain during a car journey, described as a pulling sensation on one side, as if a liquid was passing through a small pipe. The pain was intense enough to cause sweating and weakness but resolved after a short period. The patient has been maintaining her medication regimen, which was not specified in the conversation.         ALLERGIES:  is allergic to lisinopril .  MEDICATIONS:  Current Outpatient Medications  Medication Sig Dispense Refill   acetaminophen  (TYLENOL ) 500 MG tablet Take 1 tablet (500 mg total) by mouth every 6 (six) hours as needed. 30 tablet 0   anastrozole  (ARIMIDEX ) 1 MG tablet Take 1 tablet by mouth once daily 90 tablet 0   atorvastatin  (LIPITOR) 20 MG tablet Take 1 tablet (20 mg total) by mouth daily. 90 tablet 1   blood glucose meter kit and supplies KIT Dispense based on patient and insurance preference. Use up to four times daily as directed. 1 each 0   Blood Glucose Monitoring Suppl DEVI 1 each by Does not apply route in the morning, at noon, and at bedtime. May substitute to any manufacturer covered by patient's insurance. 1 each 0   calcium -vitamin D  (OSCAL WITH D) 500-200 MG-UNIT tablet Take 1 tablet by mouth 3 (three) times daily. 90 tablet 12   ondansetron  (ZOFRAN -ODT)  4 MG disintegrating tablet Take 1 tablet (4 mg total) by mouth every 8 (eight) hours as needed for nausea or vomiting. 20 tablet 0   valsartan  (DIOVAN ) 40 MG tablet Take 1 tablet (40 mg total) by mouth daily. 90 tablet 1   No current facility-administered medications for this visit.    PHYSICAL EXAMINATION: ECOG PERFORMANCE STATUS: 1 - Symptomatic but completely  ambulatory  Vitals:   12/15/23 1145  BP: (!) 150/82  Pulse: 70  Resp: 18  Temp: 97.9 F (36.6 C)  SpO2: 99%   Filed Weights   12/15/23 1145  Weight: 152 lb 14.4 oz (69.4 kg)      LABORATORY DATA:  I have reviewed the data as listed    Latest Ref Rng & Units 12/15/2023   11:11 AM 11/27/2023   10:37 PM 11/10/2023    5:57 PM  CMP  Glucose 70 - 99 mg/dL 899  888  894   BUN 8 - 23 mg/dL 11  13  21    Creatinine 0.44 - 1.00 mg/dL 9.30  9.44  9.15   Sodium 135 - 145 mmol/L 140  129  138   Potassium 3.5 - 5.1 mmol/L 4.0  3.7  4.3   Chloride 98 - 111 mmol/L 108  97  104   CO2 22 - 32 mmol/L 28  22  27    Calcium  8.9 - 10.3 mg/dL 9.6  9.0  9.7   Total Protein 6.5 - 8.1 g/dL 7.2  7.1    Total Bilirubin 0.0 - 1.2 mg/dL 0.6  0.7    Alkaline Phos 38 - 126 U/L 66  70    AST 15 - 41 U/L 16  34    ALT 0 - 44 U/L 16  25      Lab Results  Component Value Date   WBC 7.6 12/15/2023   HGB 11.9 (L) 12/15/2023   HCT 36.5 12/15/2023   MCV 77.2 (L) 12/15/2023   PLT 226 12/15/2023   NEUTROABS 4.4 12/15/2023    ASSESSMENT & PLAN:  Malignant neoplasm of upper-outer quadrant of left breast in female, estrogen receptor positive (HCC) 08/20/2022:Left lumpectomy: Grade 2 IDC 1.8 cm, extensive perineural invasion.  With DCIS grade 2, margins negative, 0/3 lymph nodes negative ER 95%, PR 20%, HER2 1+ negative, Ki-67 5%  Oncotype DX score 20: Risk of recurrence: 6% 10/30/2022: Adjuvant radiation completed   Treatment plan: Adjuvant antiestrogen therapy with anastrozole  1 mg daily x5 to 7 years   Anastrozole  toxicities: Occasional mild hot flashes and joint stiffness  Breast cancer surveillance: Breast exam 12/15/2023: Benign Mammogram should have been done 07/06/2023  Return to clinic in 1 year for follow-up  ------------------------------------- Assessment and Plan    Breast Cancer Patient is on Anastrozole  with occasional hot flashes. No new concerning symptoms. -Continue Anastrozole  as  prescribed.  Fatigue Patient reports significant fatigue with minimal exertion. This has been ongoing since the start of radiation therapy. -Recommend participation in the [facility] program for exercise and strength training.  Breast Pain Patient reports occasional pain at the site of previous surgery. Pain is tolerable and self-resolving. -No intervention required at this time.  Delayed Mammogram Mammogram was due in July but was not [place] -Order mammogram and schedule as soon as possible.  Bone Density Last bone density test was in December 2023 and was normal. Next test due in 2025. -Plan to schedule next bone density test in 2025 along with mammogram  to minimize patient trips.  Follow-up Given the stable condition, annual follow-up is appropriate.          Orders Placed This Encounter  Procedures   MM DIAG BREAST TOMO BILATERAL    MCR 06-10-22@[facility]  No problems; needs;  No implants/reduction; Hx br ca    Standing Status:   Future    Expected Date:   01/15/2024    Expiration Date:   12/14/2024    Reason for Exam (SYMPTOM  OR DIAGNOSIS REQUIRED):   annual mammogram    Preferred imaging location?:   [facility]    Release to patient:   Immediate   The patient has a good understanding of the overall plan. she agrees with it. she will call with any problems that may develop before the next visit here. Total time spent: 30 mins including face to face time and time spent for planning, charting and co-ordination of care   Viinay K Pattrick Bady, MD 12/15/23

## 2023-12-15 NOTE — Assessment & Plan Note (Signed)
 08/20/2022:Left lumpectomy: Grade 2 IDC 1.8 cm, extensive perineural invasion.  With DCIS grade 2, margins negative, 0/3 lymph nodes negative ER 95%, PR 20%, HER2 1+ negative, Ki-67 5%  Oncotype DX score 20: Risk of recurrence: 6% 10/30/2022: Adjuvant radiation completed   Treatment plan: Adjuvant antiestrogen therapy with anastrozole  1 mg daily x5 to 7 years   Anastrozole  toxicities:  Breast cancer surveillance: Breast exam 12/15/2023: Benign Mammogram should have been done 07/06/2023  Return to clinic in 1 year for follow-up

## 2024-01-08 ENCOUNTER — Ambulatory Visit
Admission: RE | Admit: 2024-01-08 | Discharge: 2024-01-08 | Disposition: A | Discharge status: Home / Self Care | Payer: Medicare Other | Source: Ambulatory Visit | Attending: Hematology and Oncology

## 2024-01-08 ENCOUNTER — Other Ambulatory Visit: Payer: Self-pay | Admitting: Hematology and Oncology

## 2024-01-08 DIAGNOSIS — Z170 Estrogen receptor positive status [ER+]: Secondary | ICD-10-CM

## 2024-01-08 DIAGNOSIS — R921 Mammographic calcification found on diagnostic imaging of breast: Secondary | ICD-10-CM

## 2024-01-15 ENCOUNTER — Other Ambulatory Visit: Payer: Self-pay | Admitting: Hematology and Oncology

## 2024-02-01 ENCOUNTER — Ambulatory Visit
Admission: RE | Admit: 2024-02-01 | Discharge: 2024-02-01 | Disposition: A | Discharge status: Home / Self Care | Source: Ambulatory Visit | Attending: Hematology and Oncology

## 2024-02-01 ENCOUNTER — Ambulatory Visit
Admission: RE | Admit: 2024-02-01 | Discharge: 2024-02-01 | Disposition: A | Discharge status: Home / Self Care | Payer: Medicare Other | Source: Ambulatory Visit | Attending: Hematology and Oncology | Admitting: Hematology and Oncology

## 2024-02-01 DIAGNOSIS — R921 Mammographic calcification found on diagnostic imaging of breast: Secondary | ICD-10-CM

## 2024-02-01 DIAGNOSIS — C50412 Malignant neoplasm of upper-outer quadrant of left female breast: Secondary | ICD-10-CM

## 2024-02-01 HISTORY — PX: BREAST BIOPSY: SHX20

## 2024-02-02 LAB — SURGICAL PATHOLOGY

## 2024-03-15 ENCOUNTER — Telehealth: Payer: Self-pay

## 2024-03-15 ENCOUNTER — Ambulatory Visit: Payer: Medicare Other | Admitting: Family

## 2024-03-15 NOTE — Telephone Encounter (Signed)
 Copied from CRM [id number]. Topic: General - Running Late >> Mar 15, 2024  5:56 PM Dyann Glaser G wrote: Patient/patient representative is calling because they are running late for an appointment. Pt daughter called stated they are 10 minutes out, Called [facility] someone answered said the phones are down send a CRM thanks  I called twice. Pt is on the way

## 2024-03-29 ENCOUNTER — Ambulatory Visit: Admitting: Family

## 2024-05-10 ENCOUNTER — Other Ambulatory Visit: Payer: Self-pay | Admitting: Family

## 2024-06-28 ENCOUNTER — Telehealth: Payer: Self-pay | Admitting: Family

## 2024-06-28 NOTE — Telephone Encounter (Signed)
 Please call pt's daughter to schedule follow up.

## 2024-06-29 NOTE — Telephone Encounter (Signed)
 Lvm 2 sch.

## 2024-07-19 ENCOUNTER — Other Ambulatory Visit: Payer: Self-pay | Admitting: Family

## 2024-07-19 MED ORDER — ATORVASTATIN CALCIUM 20 MG PO TABS: 20.0000 mg | ORAL_TABLET | Freq: Every day | ORAL | 0 refills | Status: DC

## 2024-07-19 MED ORDER — VALSARTAN 40 MG PO TABS: 40.0000 mg | ORAL_TABLET | Freq: Every day | ORAL | 0 refills | Status: DC

## 2024-07-19 NOTE — Telephone Encounter (Unsigned)
 Copied from CRM #[id number]. Topic: Clinical - Medication Refill >> Jul 19, 2024  9:40 AM Vena HERO wrote: Medication: atorvastatin  (LIPITOR) 20 MG tablet valsartan  (DIOVAN ) 40 MG tablet  Has the patient contacted their pharmacy? No (Agent: If no, request that the patient contact the pharmacy for the refill. If patient does not wish to contact the pharmacy document the reason why and proceed with request.) (Agent: If yes, when and what did the pharmacy advise?)  This is the patient's preferred pharmacy:  [facility] [address] [place], [place] - [address] [place] [address] Phone: [phone] Fax: [phone]   Is this the correct pharmacy for this prescription? Yes If no, delete pharmacy and type the correct one.   Has the prescription been filled recently? No  Is the patient out of the medication? Yes  Has the patient been seen for an appointment in the last year OR does the patient have an upcoming appointment? Yes  Can we respond through MyChart? No  Agent: Please be advised that Rx refills may take up to 3 business days. We ask that you follow-up with your pharmacy.

## 2024-08-02 ENCOUNTER — Ambulatory Visit: Admitting: Family

## 2024-08-02 VITALS — BP 125/66 | HR 63 | Temp 97.9°F | Resp 12 | Ht 63.0 in | Wt 157.8 lb

## 2024-08-02 DIAGNOSIS — Z794 Long term (current) use of insulin: Secondary | ICD-10-CM

## 2024-08-02 DIAGNOSIS — R809 Proteinuria, unspecified: Secondary | ICD-10-CM

## 2024-08-02 DIAGNOSIS — H5712 Ocular pain, left eye: Secondary | ICD-10-CM | POA: Diagnosis not present

## 2024-08-02 DIAGNOSIS — Z170 Estrogen receptor positive status [ER+]: Secondary | ICD-10-CM

## 2024-08-02 DIAGNOSIS — C50412 Malignant neoplasm of upper-outer quadrant of left female breast: Secondary | ICD-10-CM

## 2024-08-02 DIAGNOSIS — K0889 Other specified disorders of teeth and supporting structures: Secondary | ICD-10-CM | POA: Diagnosis not present

## 2024-08-02 DIAGNOSIS — E1129 Type 2 diabetes mellitus with other diabetic kidney complication: Secondary | ICD-10-CM

## 2024-08-02 DIAGNOSIS — Z1211 Encounter for screening for malignant neoplasm of colon: Secondary | ICD-10-CM

## 2024-08-02 DIAGNOSIS — E785 Hyperlipidemia, unspecified: Secondary | ICD-10-CM | POA: Diagnosis not present

## 2024-08-02 DIAGNOSIS — G5602 Carpal tunnel syndrome, left upper limb: Secondary | ICD-10-CM

## 2024-08-02 MED ORDER — BLOOD GLUCOSE TEST VI STRP: 1.0000 | ORAL_STRIP | Freq: Three times a day (TID) | 3 refills | Status: AC

## 2024-08-02 MED ORDER — VALSARTAN 40 MG PO TABS: 40.0000 mg | ORAL_TABLET | Freq: Every day | ORAL | 1 refills | Status: AC

## 2024-08-02 MED ORDER — LANCETS MISC. MISC: 1.0000 | Freq: Three times a day (TID) | 3 refills | Status: AC

## 2024-08-02 MED ORDER — ATORVASTATIN CALCIUM 20 MG PO TABS: 20.0000 mg | ORAL_TABLET | Freq: Every day | ORAL | 1 refills | Status: AC

## 2024-08-02 MED ORDER — BLOOD GLUCOSE MONITORING SUPPL DEVI
1.0000 | Freq: Three times a day (TID) | 0 refills | Status: AC
Start: 2024-08-02 — End: ?

## 2024-08-02 MED ORDER — BLOOD GLUCOSE MONITOR KIT: PACK | 0 refills | Status: AC

## 2024-08-02 MED ORDER — LANCET DEVICE MISC: 1.0000 | Freq: Three times a day (TID) | 0 refills | Status: AC

## 2024-08-02 NOTE — Assessment & Plan Note (Signed)
  Recent left eye pain and photopsia with concern for retinal detachment if symptoms recur. - Refer to ophthalmology for evaluation and annual eye exam. - Advise ER visit if eye pain or photopsia recur.

## 2024-08-02 NOTE — Assessment & Plan Note (Signed)
 Hx of lumpectomy, radiation, now on anastrozole  1 mg daily x5 to 7 years which is being monitored by Oncology.

## 2024-08-02 NOTE — Assessment & Plan Note (Signed)
--  Continue ARB 

## 2024-08-02 NOTE — Assessment & Plan Note (Signed)
 Daughter has a Education officer, community that she likes who is close to her mother's home. Recommended that they bring pt for an evaluation with their family dentist.

## 2024-08-02 NOTE — Patient Instructions (Signed)
 VISIT SUMMARY:  During your visit, we addressed your dental pain, eye pain, hand numbness, and reviewed your diabetes management. We also discussed the need for a colonoscopy and cholesterol check.  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 Your last A1c test was two years ago. -Schedule lab work for A1c, kidney function, cholesterol, and urine analysis. -We will prescribe a glucose meter for you to pick up at [facility].  LEFT EYE PAIN AND PHOTOPSIA: You recently experienced left eye pain and flashing lights, which could indicate a serious issue like retinal detachment. -We will refer you to an ophthalmologist for an evaluation and an annual eye exam. -If you experience eye pain or flashing lights again, please go to the emergency room immediately.  LEFT HAND NUMBNESS, LIKELY CARPAL TUNNEL SYNDROME: You have numbness in your left hand at night, which is likely due to carpal tunnel syndrome. -Use an over-the-counter wrist brace during sleep and as needed. -Monitor your symptoms and let us  know if there is no improvement.  DENTAL CARIES: Your tooth pain is likely due to cavities in your back molar. -Make an appointment with [facility] for evaluation and treatment.  HYPERLIPIDEMIA: We need to check your cholesterol levels. -Your cholesterol will be checked during your upcoming lab work.  COLORECTAL CANCER SCREENING: You have not had a colonoscopy yet. We discussed the options for colorectal cancer screening. -We will refer you to [facility] for a colonoscopy, and you can request a female provider if you prefer. -If a colonoscopy is not feasible, we can discuss using Cologuard as an alternative.

## 2024-08-02 NOTE — Assessment & Plan Note (Addendum)
 Lab Results  Component Value Date   HGBA1C 7.0 (H) 11/18/2022   HGBA1C 6.9 (H) 08/12/2022   HGBA1C 6.8 (H) 05/12/2022   Lab Results  Component Value Date   MICROALBUR 10 06/18/2017   LDLCALC 96 11/18/2022   CREATININE 0.69 12/15/2023   She is very overdue for labs. Unfortunately the lab is closed now. Recommend that she return this week for lab visit.

## 2024-08-02 NOTE — Progress Notes (Signed)
 Subjective:     Patient ID: Chloe Reid, female    DOB: 12/31/1955, 68 y.o.   MRN: [id number]  Chief Complaint  Patient presents with   Follow-up    Needs refills on  Atorvastatin  and valsartan    dental pain.  would like to be referred to dentist    HPI  Discussed the use of AI scribe software for clinical note transcription with the patient, who gave verbal consent to proceed.  History of Present Illness   Chloe Reid is a 67 year old female who presents today for routine follow up. Chloe Reid c/o with dental pain. Chloe Reid is accompanied by Chloe Reid daughter and two grandchildren.   Chloe Reid experiences dental pain, particularly in the gum area in the upper right posterior mouth.   A week ago, Chloe Reid experienced left eye pain and flashing lights, which lasted for a day without any changes in vision since then. Reports resolution of pain.   Chloe Reid experiences numbness in Chloe Reid left hand at night, describing it as the whole hand going numb.    Health Maintenance Due  Topic Date Due   Colonoscopy  Never done   Diabetic kidney evaluation - Urine ACR  06/18/2018   Zoster Vaccines- Shingrix  (2 of 2) 07/07/2022   Medicare Annual Wellness (AWV)  05/13/2023   HEMOGLOBIN A1C  05/20/2023   OPHTHALMOLOGY EXAM  01/10/2024    Past Medical History:  Diagnosis Date   Breast cancer (HCC)    left breast IDC with DCIS   Cataracts, bilateral    Glaucoma    Hypertension    Kidney stone    Macular drusen    Migraine    Pre-diabetes     Past Surgical History:  Procedure Laterality Date   BREAST BIOPSY Right 02/01/2024   MM RT BREAST BX W LOC DEV 1ST LESION IMAGE BX SPEC STEREO GUIDE 02/01/2024 [facility]   BREAST LUMPECTOMY WITH RADIOACTIVE SEED AND SENTINEL LYMPH NODE BIOPSY Left 08/20/2022   Procedure: LEFT BREAST LUMPECTOMY WITH RADIOACTIVE SEED AND SENTINEL LYMPH NODE BIOPSY;  Surgeon: Aron Shoulders, MD;  Location: [facility];  Service: General;  Laterality: Left;   cataract left Left  10/2022   EYE SURGERY Right    cataract   uterine biopsy      Family History  Problem Relation Age of Onset   Heart disease Mother    Heart disease Father    Kidney disease Father     Social History   Socioeconomic History   Marital status: Married    Spouse name: Not on file   Number of children: Not on file   Years of education: Not on file   Highest education level: Not on file  Occupational History   Not on file  Tobacco Use   Smoking status: Never   Smokeless tobacco: Never  Vaping Use   Vaping status: Never Used  Substance and Sexual Activity   Alcohol use: No   Drug use: Never   Sexual activity: Not Currently    Birth control/protection: Post-menopausal  Other Topics Concern   Not on file  Social History Narrative   Has 4 children- 3 daughter's 1 son, all live at home   Married   Cooks at [employer]   Enjoys television, cooking   No pets   Grew up in [place]- moved here in 1998   Social Drivers of Corporate investment banker Strain: Not on [employer] Insecurity: Not on file  Transportation Needs: Not  on file  Physical Activity: Not on file  Stress: Not on file  Social Connections: Not on file  Intimate Partner Violence: Not on file    Outpatient Medications Prior to Visit  Medication Sig Dispense Refill   acetaminophen  (TYLENOL ) 500 MG tablet Take 1 tablet (500 mg total) by mouth every 6 (six) hours as needed. 30 tablet 0   anastrozole  (ARIMIDEX ) 1 MG tablet Take 1 tablet by mouth once daily 90 tablet 3   calcium -vitamin D  (OSCAL WITH D) 500-200 MG-UNIT tablet Take 1 tablet by mouth 3 (three) times daily. 90 tablet 12   atorvastatin  (LIPITOR) 20 MG tablet Take 1 tablet (20 mg total) by mouth daily. Needs appt 30 tablet 0   valsartan  (DIOVAN ) 40 MG tablet Take 1 tablet (40 mg total) by mouth daily. Needs appt 30 tablet 0   blood glucose meter kit and supplies KIT Dispense based on patient and insurance preference. Use up to four times daily as  directed. 1 each 0   Blood Glucose Monitoring Suppl DEVI 1 each by Does not apply route in the morning, at noon, and at bedtime. May substitute to any manufacturer covered by patient's insurance. 1 each 0   ondansetron  (ZOFRAN -ODT) 4 MG disintegrating tablet Take 1 tablet (4 mg total) by mouth every 8 (eight) hours as needed for nausea or vomiting. 20 tablet 0   No facility-administered medications prior to visit.    Allergies  Allergen Reactions   Lisinopril      Dizziness    ROS See HPI    Objective:    Physical Exam Constitutional:      General: Chloe Reid is not in acute distress.    Appearance: Normal appearance. Chloe Reid is well-developed.  HENT:     Head: Normocephalic and atraumatic.     Comments: Poor dentition, multiple caries noted    Right Ear: External ear normal.     Left Ear: External ear normal.  Eyes:     General: No scleral icterus. Neck:     Thyroid : No thyromegaly.  Cardiovascular:     Rate and Rhythm: Normal rate and regular rhythm.     Heart sounds: Normal heart sounds. No murmur heard. Pulmonary:     Effort: Pulmonary effort is normal. No respiratory distress.     Breath sounds: Normal breath sounds. No wheezing.  Musculoskeletal:     Cervical back: Neck supple.  Skin:    General: Skin is warm and dry.  Neurological:     Mental Status: Chloe Reid is alert and oriented to person, place, and time.  Psychiatric:        Mood and Affect: Mood normal.        Behavior: Behavior normal.        Thought Content: Thought content normal.        Judgment: Judgment normal.    Diabetic Foot Exam - Simple   Simple Foot Form Diabetic Foot exam was performed with the following findings: Yes 08/02/2024  6:49 PM  Visual Inspection No deformities, no ulcerations, no other skin breakdown bilaterally: Yes Sensation Testing Intact to touch and monofilament testing bilaterally: Yes Pulse Check Posterior Tibialis and Dorsalis pulse intact bilaterally: Yes Comments       BP  125/66 (BP Location: Right Arm, Patient Position: Sitting, Cuff Size: Normal)   Pulse 63   Temp 97.9 F (36.6 C) (Oral)   Resp 12   Ht 5' 3 (1.6 m)   Wt 157 lb 12.8 oz (71.6 kg)   LMP  12/02/2003   SpO2 98%   BMI 27.95 kg/m  Wt Readings from Last 3 Encounters:  08/02/24 157 lb 12.8 oz (71.6 kg)  12/15/23 152 lb 14.4 oz (69.4 kg)  11/27/23 154 lb (69.9 kg)       Assessment & Plan:   Problem List Items Addressed This Visit       Unprioritized   Pain, dental   Daughter has a dentist that Chloe Reid likes who is close to Chloe Reid mother's home. Recommended that they bring Chloe Reid for an evaluation with their family dentist.       Microalbuminuria due to type 2 diabetes mellitus (HCC)   Continue ARB.       Relevant Medications   valsartan  (DIOVAN ) 40 MG tablet   atorvastatin  (LIPITOR) 20 MG tablet   Malignant neoplasm of upper-outer quadrant of left breast in female, estrogen receptor positive (HCC)   Hx of lumpectomy, radiation, now on anastrozole  1 mg daily x5 to 7 years which is being monitored by Oncology.       Hyperlipidemia   Relevant Medications   valsartan  (DIOVAN ) 40 MG tablet   atorvastatin  (LIPITOR) 20 MG tablet   Other Relevant Orders   Lipid panel   Eye pain, left    Recent left eye pain and photopsia with concern for retinal detachment if symptoms recur. - Refer to ophthalmology for evaluation and annual eye exam. - Advise ER visit if eye pain or photopsia recur.      Relevant Orders   Ambulatory referral to Ophthalmology   Controlled type 2 diabetes mellitus with microalbuminuria, without long-term current use of insulin (HCC)   Lab Results  Component Value Date   HGBA1C 7.0 (H) 11/18/2022   HGBA1C 6.9 (H) 08/12/2022   HGBA1C 6.8 (H) 05/12/2022   Lab Results  Component Value Date   MICROALBUR 10 06/18/2017   LDLCALC 96 11/18/2022   CREATININE 0.69 12/15/2023   Chloe Reid is very overdue for labs. Unfortunately the lab is closed now. Recommend that Chloe Reid return this  week for lab visit.       Relevant Medications   valsartan  (DIOVAN ) 40 MG tablet   atorvastatin  (LIPITOR) 20 MG tablet   blood glucose meter kit and supplies KIT   Blood Glucose Monitoring Suppl DEVI   Glucose Blood (BLOOD GLUCOSE TEST STRIPS) STRP   Lancet Device MISC   Lancets Misc. MISC   Other Relevant Orders   Comp Met (CMET)   HgB A1c   Urine Microalbumin w/creat. ratio   Ambulatory referral to Ophthalmology   Carpal tunnel syndrome of left wrist   Recommend left sided wrist splint HS and prn during the day.       Other Visit Diagnoses       Screening for colon cancer    -  Primary   Relevant Orders   Ambulatory referral to Gastroenterology       I have discontinued Carolyn Culbreath's Blood Glucose Monitoring Suppl and ondansetron . I have also changed Chloe Reid valsartan . Additionally, I am having Chloe Reid start on Blood Glucose Monitoring Suppl, BLOOD GLUCOSE TEST STRIPS, Lancet Device, and Lancets Misc.SABRA Lastly, I am having Chloe Reid maintain Chloe Reid acetaminophen , calcium -vitamin D , anastrozole , atorvastatin , and blood glucose meter kit and supplies.  Meds ordered this encounter  Medications   valsartan  (DIOVAN ) 40 MG tablet    Sig: Take 1 tablet (40 mg total) by mouth daily.    Dispense:  90 tablet    Refill:  1    Supervising Provider:   DOMENICA,  STACEY A [[id number]]   atorvastatin  (LIPITOR) 20 MG tablet    Sig: Take 1 tablet (20 mg total) by mouth daily. Needs appt    Dispense:  90 tablet    Refill:  1    Supervising Provider:   DOMENICA BLACKBIRD A [[id number]]   blood glucose meter kit and supplies KIT    Sig: Dispense based on patient and insurance preference. Use up to four times daily as directed.    Dispense:  1 each    Refill:  0    Supervising Provider:   DOMENICA BLACKBIRD A [[id number]]    Number of strips:   100    Number of lancets:   100   Blood Glucose Monitoring Suppl DEVI    Sig: 1 each by Does not apply route in the morning, at noon, and at bedtime. May substitute to any manufacturer covered  by patient's insurance.    Dispense:  1 each    Refill:  0    Supervising Provider:   DOMENICA BLACKBIRD A [[id number]]   Glucose Blood (BLOOD GLUCOSE TEST STRIPS) STRP    Sig: 1 each by In Vitro route in the morning, at noon, and at bedtime. May substitute to any manufacturer covered by patient's insurance.    Dispense:  100 strip    Refill:  3    Supervising Provider:   DOMENICA BLACKBIRD A [[id number]]   Lancet Device MISC    Sig: 1 each by Does not apply route in the morning, at noon, and at bedtime. May substitute to any manufacturer covered by patient's insurance.    Dispense:  1 each    Refill:  0    Supervising Provider:   DOMENICA BLACKBIRD A [[id number]]   Lancets Misc. MISC    Sig: 1 each by Does not apply route in the morning, at noon, and at bedtime. May substitute to any manufacturer covered by patient's insurance.    Dispense:  100 each    Refill:  3    Supervising Provider:   DOMENICA BLACKBIRD A [[id number]]

## 2024-08-02 NOTE — Assessment & Plan Note (Signed)
 Recommend left sided wrist splint HS and prn during the day.

## 2024-08-04 ENCOUNTER — Other Ambulatory Visit

## 2024-11-01 ENCOUNTER — Ambulatory Visit: Admitting: Family

## 2024-12-15 ENCOUNTER — Inpatient Hospital Stay: Payer: Medicare Other | Attending: Hematology and Oncology | Admitting: Hematology and Oncology

## 2024-12-15 NOTE — Assessment & Plan Note (Signed)
 08/20/2022:Left lumpectomy: Grade 2 IDC 1.8 cm, extensive perineural invasion.  With DCIS grade 2, margins negative, 0/3 lymph nodes negative ER 95%, PR 20%, HER2 1+ negative, Ki-67 5%  Oncotype DX score 20: Risk of recurrence: 6% 10/30/2022: Adjuvant radiation completed   Treatment plan: Adjuvant antiestrogen therapy with anastrozole  1 mg daily x5 to 7 years   Anastrozole  toxicities: Occasional mild hot flashes and joint stiffness   Breast cancer surveillance: Breast exam 12/15/2024: Benign Mammogram 01/08/2024 indeterminate calcifications right breast 0.4 cm (biopsy benign)   Return to clinic in 1 year for follow-up
